# Patient Record
Sex: Male | Born: 1970 | Race: White | Hispanic: No | Marital: Married | Smoking: Never smoker
Health system: Southern US, Community
[De-identification: ages and names within clinical notes are randomized; demographics above are authoritative.]

## PROBLEM LIST (undated history)

## (undated) DIAGNOSIS — S46211D Strain of muscle, fascia and tendon of other parts of biceps, right arm, subsequent encounter: Secondary | ICD-10-CM

## (undated) DIAGNOSIS — I10 Essential (primary) hypertension: Secondary | ICD-10-CM

## (undated) HISTORY — DX: Strain of muscle, fascia and tendon of other parts of biceps, right arm, subsequent encounter: S46.211D

---

## 2005-11-07 ENCOUNTER — Emergency Department: Payer: Self-pay | Admitting: Emergency Medicine

## 2008-06-02 ENCOUNTER — Emergency Department: Payer: Self-pay | Admitting: Unknown Physician Specialty

## 2009-01-26 ENCOUNTER — Emergency Department: Payer: Self-pay | Admitting: Emergency Medicine

## 2010-07-12 ENCOUNTER — Emergency Department: Payer: Self-pay | Admitting: Emergency Medicine

## 2014-02-02 ENCOUNTER — Emergency Department: Payer: Self-pay | Admitting: Emergency Medicine

## 2015-05-30 IMAGING — CR DG CHEST 2V
1 series · 1 of 1 positions shown · non-contrast
Comparison: None.

CLINICAL DATA: Pain post trauma

EXAM:
CHEST  2 VIEW

[w chest pa]
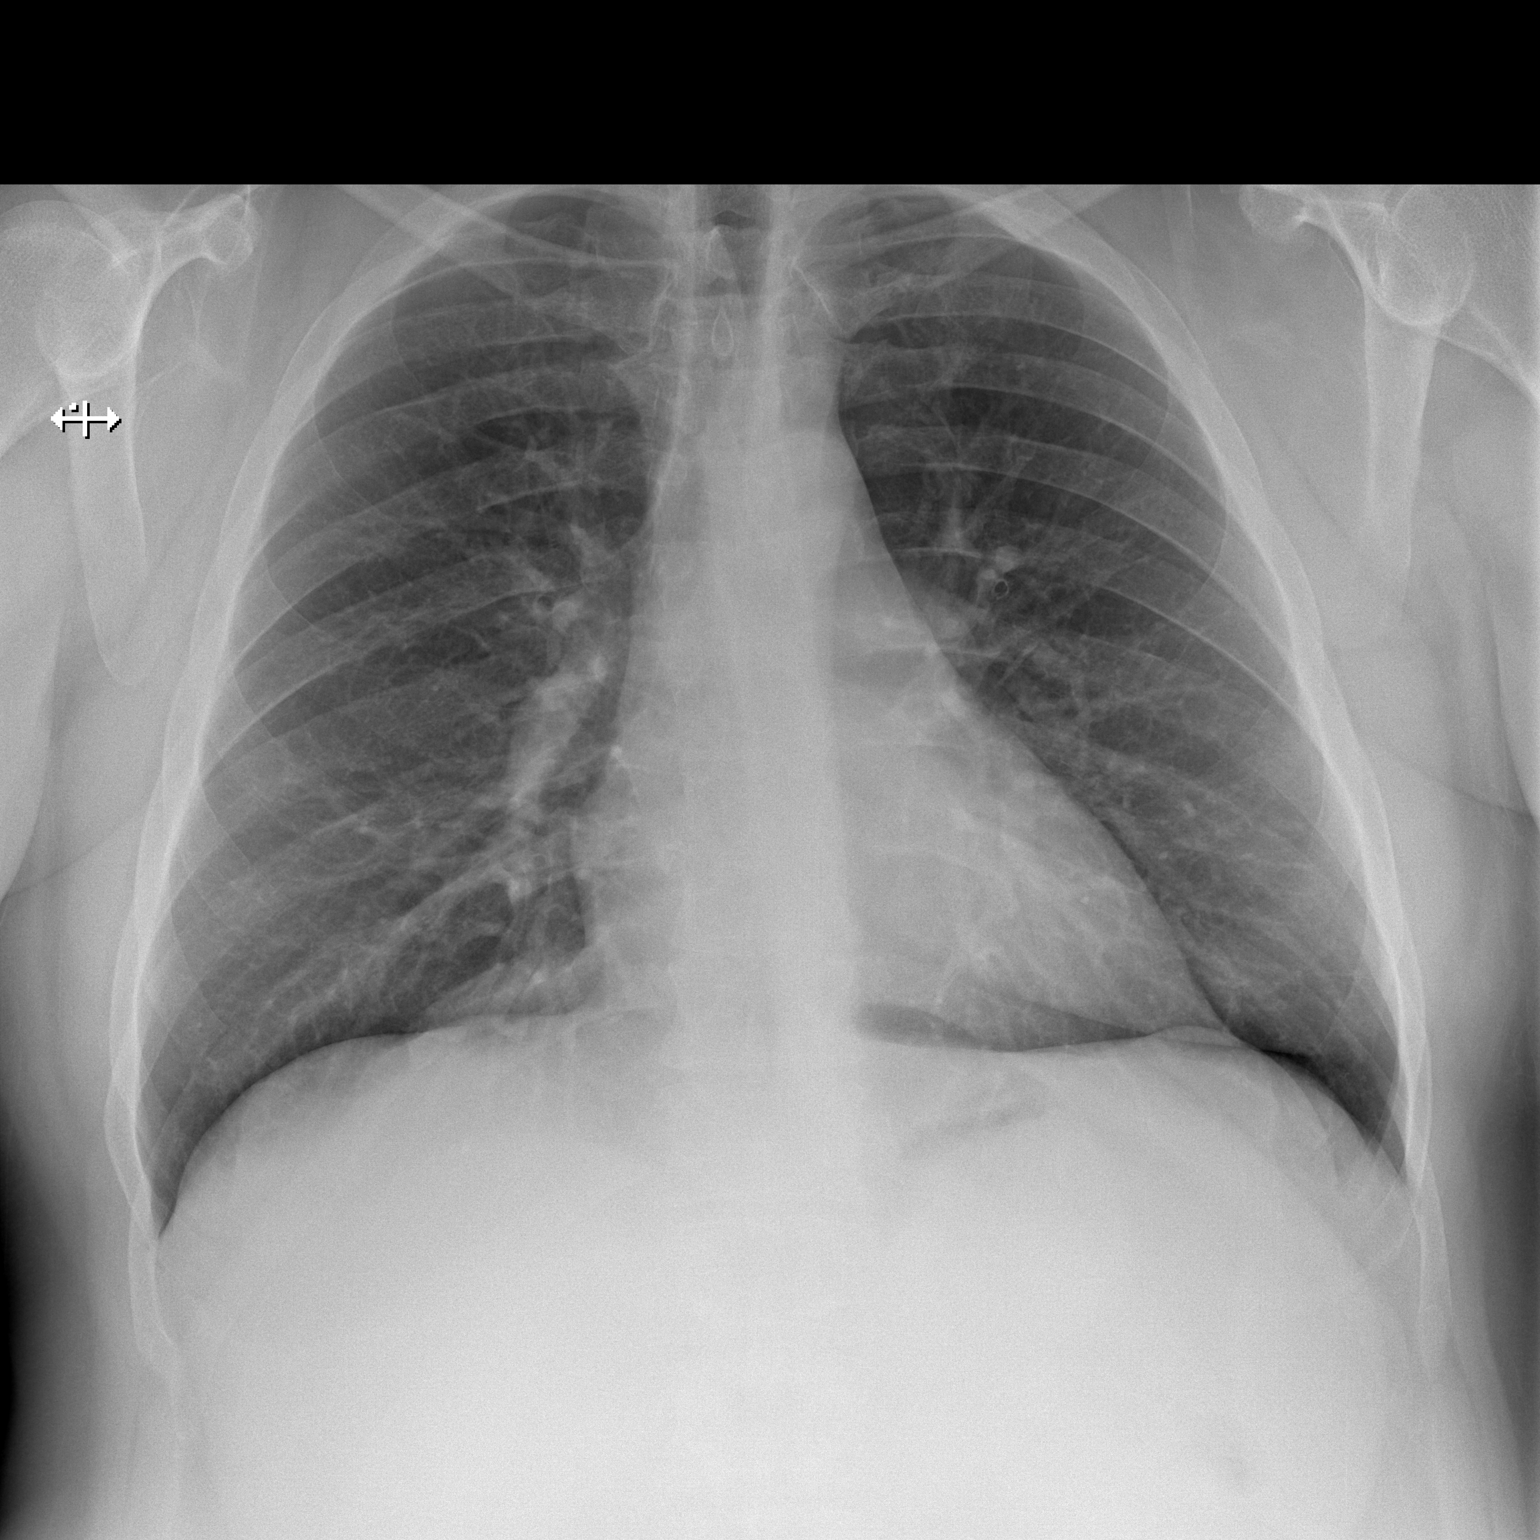

[1 of 1 positions shown; findings below may reference images not displayed]

FINDINGS: Lungs are clear. Heart size and pulmonary vascularity are normal. No
pneumothorax. No adenopathy. No bone lesions.
IMPRESSION: No abnormality noted.

## 2015-08-07 ENCOUNTER — Encounter: Payer: Self-pay | Admitting: *Deleted

## 2015-08-10 ENCOUNTER — Ambulatory Visit: Payer: 59 | Admitting: Anesthesiology

## 2015-08-10 ENCOUNTER — Ambulatory Visit
Admission: RE | Admit: 2015-08-10 | Discharge: 2015-08-10 | Disposition: A | Discharge status: Home / Self Care | Payer: 59 | Source: Ambulatory Visit | Attending: Gastroenterology | Admitting: Gastroenterology

## 2015-08-10 ENCOUNTER — Encounter: Payer: Self-pay | Admitting: *Deleted

## 2015-08-10 ENCOUNTER — Encounter
Admission: RE | Disposition: A | Discharge status: Home / Self Care | Payer: Self-pay | Source: Ambulatory Visit | Attending: Gastroenterology

## 2015-08-10 DIAGNOSIS — K6389 Other specified diseases of intestine: Secondary | ICD-10-CM | POA: Insufficient documentation

## 2015-08-10 DIAGNOSIS — Z8249 Family history of ischemic heart disease and other diseases of the circulatory system: Secondary | ICD-10-CM | POA: Insufficient documentation

## 2015-08-10 DIAGNOSIS — Z803 Family history of malignant neoplasm of breast: Secondary | ICD-10-CM | POA: Diagnosis not present

## 2015-08-10 DIAGNOSIS — Z8719 Personal history of other diseases of the digestive system: Secondary | ICD-10-CM | POA: Diagnosis not present

## 2015-08-10 DIAGNOSIS — Z1211 Encounter for screening for malignant neoplasm of colon: Secondary | ICD-10-CM | POA: Insufficient documentation

## 2015-08-10 DIAGNOSIS — Z800 Family history of malignant neoplasm of digestive organs: Secondary | ICD-10-CM | POA: Diagnosis present

## 2015-08-10 HISTORY — PX: COLONOSCOPY WITH PROPOFOL: SHX5780

## 2015-08-10 HISTORY — DX: Essential (primary) hypertension: I10

## 2015-08-10 SURGERY — COLONOSCOPY WITH PROPOFOL
Anesthesia: General

## 2015-08-10 MED ORDER — SODIUM CHLORIDE 0.9 % IV SOLN
INTRAVENOUS | Status: DC
  Administered 2015-08-10: 14:00:00 via INTRAVENOUS

## 2015-08-10 MED ORDER — PROPOFOL 500 MG/50ML IV EMUL
INTRAVENOUS | Status: DC | PRN
  Administered 2015-08-10: 130 ug/kg/min via INTRAVENOUS

## 2015-08-10 MED ORDER — SODIUM CHLORIDE 0.9 % IV SOLN: INTRAVENOUS | Status: DC

## 2015-08-10 MED ORDER — MIDAZOLAM HCL 2 MG/2ML IJ SOLN
INTRAMUSCULAR | Status: DC | PRN
  Administered 2015-08-10: 1 mg via INTRAVENOUS

## 2015-08-10 MED ORDER — SODIUM CHLORIDE 0.9 % IV SOLN
INTRAVENOUS | Status: DC
Start: 2015-08-10 — End: 2015-08-10
  Administered 2015-08-10: 14:00:00 via INTRAVENOUS

## 2015-08-10 MED ORDER — FENTANYL CITRATE (PF) 100 MCG/2ML IJ SOLN
INTRAMUSCULAR | Status: DC | PRN
  Administered 2015-08-10: 50 ug via INTRAVENOUS

## 2015-08-10 NOTE — H&P (Signed)
  Date of Initial H&P: 07/30/2015  History reviewed, patient examined, no change in status, stable for surgery. 

## 2015-08-10 NOTE — Anesthesia Preprocedure Evaluation (Addendum)
Anesthesia Evaluation  Patient identified by MRN, date of birth, ID band Patient awake    Reviewed: Allergy & Precautions, NPO status , Patient's Chart, lab work & pertinent test results  Airway Mallampati: III  TM Distance: >3 FB Neck ROM: Full    Dental  (+) Chipped   Pulmonary neg pulmonary ROS,    Pulmonary exam normal breath sounds clear to auscultation       Cardiovascular hypertension, Normal cardiovascular exam     Neuro/Psych negative neurological ROS  negative psych ROS   GI/Hepatic negative GI ROS, Neg liver ROS,   Endo/Other  negative endocrine ROS  Renal/GU negative Renal ROS  negative genitourinary   Musculoskeletal negative musculoskeletal ROS (+)   Abdominal Normal abdominal exam  (+)   Peds negative pediatric ROS (+)  Hematology negative hematology ROS (+)   Anesthesia Other Findings   Reproductive/Obstetrics                            Anesthesia Physical Anesthesia Plan  ASA: II  Anesthesia Plan: General   Post-op Pain Management:    Induction: Intravenous  Airway Management Planned: Nasal Cannula  Additional Equipment:   Intra-op Plan:   Post-operative Plan:   Informed Consent: I have reviewed the patients History and Physical, chart, labs and discussed the procedure including the risks, benefits and alternatives for the proposed anesthesia with the patient or authorized representative who has indicated his/her understanding and acceptance.   Dental advisory given  Plan Discussed with: CRNA and Surgeon  Anesthesia Plan Comments:         Anesthesia Quick Evaluation

## 2015-08-10 NOTE — Transfer of Care (Signed)
Immediate Anesthesia Transfer of Care Note  Patient: Jose JunglingChristopher T Behan Sr.  Procedure(s) Performed: Procedure(s): COLONOSCOPY WITH PROPOFOL (N/A)  Patient Location: PACU and Endoscopy Unit  Anesthesia Type:General  Level of Consciousness: sedated and responds to stimulation  Airway & Oxygen Therapy: Patient Spontanous Breathing and Patient connected to nasal cannula oxygen  Post-op Assessment: Report given to RN and Post -op Vital signs reviewed and stable  Post vital signs: Reviewed and stable  Last Vitals:  Filed Vitals:   08/10/15 1342 08/10/15 1442  BP: 133/80 119/69  Pulse: 62 69  Temp: 37.4 C 36.4 C  Resp: 15     Complications: No apparent anesthesia complications

## 2015-08-10 NOTE — Anesthesia Postprocedure Evaluation (Signed)
Anesthesia Post Note  Patient: Jose JunglingChristopher T Sardo Sr.  Procedure(s) Performed: Procedure(s) (LRB): COLONOSCOPY WITH PROPOFOL (N/A)  Patient location during evaluation: PACU Anesthesia Type: General Level of consciousness: awake Pain management: pain level controlled Vital Signs Assessment: post-procedure vital signs reviewed and stable Respiratory status: spontaneous breathing Cardiovascular status: blood pressure returned to baseline Postop Assessment: No headache Anesthetic complications: no    Last Vitals:  Filed Vitals:   08/10/15 1342 08/10/15 1442  BP: 133/80 119/69  Pulse: 62 69  Temp: 37.4 C 36.4 C  Resp: 15     Last Pain: There were no vitals filed for this visit.               Elizabth Palka M

## 2015-08-10 NOTE — Op Note (Signed)
[facility] Gastroenterology Patient Name: Jose Paul Procedure Date: 08/10/2015 2:16 PM MRN: [id number] Account #: [id number] Date of Birth: 11/15/1970 Admit Type: Outpatient Age: 44 Room: [facility] ENDO ROOM 4 Gender: Male Note Status: Finalized Procedure:         Colonoscopy Indications:       Screening in patient at increased risk: Family history of                     1st-degree relative with colorectal cancer Providers:         Ezzard Standing. Bluford Kaufmann, MD Referring MD:      Silas Flood. Ellsworth Lennox, MD (Referring MD) Medicines:         Monitored Anesthesia Care Complications:     No immediate complications. Procedure:         Pre-Anesthesia Assessment:                    - Prior to the procedure, a History and Physical was                     performed, and patient medications, allergies and                     sensitivities were reviewed. The patient's tolerance of                     previous anesthesia was reviewed.                    - The risks and benefits of the procedure and the sedation                     options and risks were discussed with the patient. All                     questions were answered and informed consent was obtained.                    - After reviewing the risks and benefits, the patient was                     deemed in satisfactory condition to undergo the procedure.                    After obtaining informed consent, the colonoscope was                     passed under direct vision. Throughout the procedure, the                     patient's blood pressure, pulse, and oxygen saturations                     were monitored continuously. The Colonoscope was                     introduced through the anus and advanced to the the cecum,                     identified by appendiceal orifice and ileocecal valve. The                     colonoscopy was performed without difficulty. The patient  tolerated the procedure well.  The quality of the bowel                     preparation was good. Findings:      The colon (entire examined portion) appeared normal. Possible nodule at       ileocecal valve. Biopsies taken. Impression:        - The entire examined colon is normal.                    - No specimens collected. Recommendation:    - Discharge patient to home.                    - Await pathology results.                    - Repeat colonoscopy in 5 years for surveillance.                    - The findings and recommendations were discussed with the                     patient. Procedure Code(s): --- Professional ---                    [phone] [id number], Colonoscopy, flexible; diagnostic, including                     collection of specimen(s) by brushing or washing, when                     performed (separate procedure) Diagnosis Code(s): --- Professional ---                    Z80.0, Family history of malignant neoplasm of digestive                     organs CPT copyright 2014 American Medical Association. All rights reserved. The codes documented in this report are preliminary and upon coder review may  be revised to meet current compliance requirements. Wallace CullensPaul Y Millette Halberstam, MD 08/10/2015 2:38:18 PM This report has been signed electronically. Number of Addenda: 0 Note Initiated On: 08/10/2015 2:16 PM Scope Withdrawal Time: 0 hours 7 minutes 36 seconds  Total Procedure Duration: 0 hours 9 minutes 34 seconds       [facility]

## 2015-08-11 ENCOUNTER — Encounter: Payer: Self-pay | Admitting: Gastroenterology

## 2015-08-12 LAB — SURGICAL PATHOLOGY

## 2022-11-17 ENCOUNTER — Other Ambulatory Visit: Payer: BC Managed Care – PPO

## 2022-11-17 ENCOUNTER — Other Ambulatory Visit: Payer: Self-pay | Admitting: Internal Medicine

## 2022-11-19 LAB — COMPREHENSIVE METABOLIC PANEL
ALT: 33 IU/L (ref 0–44)
AST: 26 IU/L (ref 0–40)
Albumin/Globulin Ratio: 1.7 (ref 1.2–2.2)
Albumin: 4.7 g/dL (ref 3.8–4.9)
Alkaline Phosphatase: 93 IU/L (ref 44–121)
BUN/Creatinine Ratio: 9 (ref 9–20)
BUN: 12 mg/dL (ref 6–24)
Bilirubin Total: 0.6 mg/dL (ref 0.0–1.2)
CO2: 22 mmol/L (ref 20–29)
Calcium: 9.3 mg/dL (ref 8.7–10.2)
Chloride: 101 mmol/L (ref 96–106)
Creatinine, Ser: 1.29 mg/dL — ABNORMAL HIGH (ref 0.76–1.27)
Globulin, Total: 2.8 g/dL (ref 1.5–4.5)
Glucose: 132 mg/dL — ABNORMAL HIGH (ref 70–99)
Potassium: 4.6 mmol/L (ref 3.5–5.2)
Sodium: 139 mmol/L (ref 134–144)
Total Protein: 7.5 g/dL (ref 6.0–8.5)
eGFR: 67 mL/min/{1.73_m2} (ref >59)

## 2022-11-19 LAB — LIPID PANEL W/O CHOL/HDL RATIO
Cholesterol, Total: 200 mg/dL — ABNORMAL HIGH (ref 100–199)
HDL: 46 mg/dL (ref >39)
LDL Chol Calc (NIH): 140 mg/dL — ABNORMAL HIGH (ref 0–99)
Triglycerides: 78 mg/dL (ref 0–149)
VLDL Cholesterol Cal: 14 mg/dL (ref 5–40)

## 2022-11-19 LAB — CBC WITH DIFFERENTIAL/PLATELET
Basophils Absolute: 0 10*3/uL (ref 0.0–0.2)
Basos: 0 %
EOS (ABSOLUTE): 0.2 10*3/uL (ref 0.0–0.4)
Eos: 3 %
Hematocrit: 45.3 % (ref 37.5–51.0)
Hemoglobin: 15.4 g/dL (ref 13.0–17.7)
Immature Grans (Abs): 0 10*3/uL (ref 0.0–0.1)
Immature Granulocytes: 0 %
Lymphocytes Absolute: 1.1 10*3/uL (ref 0.7–3.1)
Lymphs: 16 %
MCH: 29.7 pg (ref 26.6–33.0)
MCHC: 34 g/dL (ref 31.5–35.7)
MCV: 88 fL (ref 79–97)
Monocytes Absolute: 0.5 10*3/uL (ref 0.1–0.9)
Monocytes: 8 %
Neutrophils Absolute: 5 10*3/uL (ref 1.4–7.0)
Neutrophils: 73 %
Platelets: 239 10*3/uL (ref 150–450)
RBC: 5.18 x10E6/uL (ref 4.14–5.80)
RDW: 13 % (ref 11.6–15.4)
WBC: 6.8 10*3/uL (ref 3.4–10.8)

## 2022-11-19 LAB — PSA, SERUM (SERIAL MONITOR): Prostate Specific Ag, Serum: 0.9 ng/mL (ref 0.0–4.0)

## 2022-11-22 ENCOUNTER — Encounter: Payer: Self-pay | Admitting: Internal Medicine

## 2022-11-22 ENCOUNTER — Encounter: Payer: BC Managed Care – PPO | Admitting: Internal Medicine

## 2022-11-23 ENCOUNTER — Ambulatory Visit (INDEPENDENT_AMBULATORY_CARE_PROVIDER_SITE_OTHER): Payer: BC Managed Care – PPO | Admitting: Internal Medicine

## 2022-11-23 ENCOUNTER — Encounter: Payer: Self-pay | Admitting: Internal Medicine

## 2022-11-23 VITALS — BP 104/84 | HR 95 | Ht 71.0 in | Wt 302.4 lb

## 2022-11-23 DIAGNOSIS — Z0001 Encounter for general adult medical examination with abnormal findings: Secondary | ICD-10-CM | POA: Diagnosis not present

## 2022-11-23 DIAGNOSIS — E7800 Pure hypercholesterolemia, unspecified: Secondary | ICD-10-CM

## 2022-11-23 DIAGNOSIS — N182 Chronic kidney disease, stage 2 (mild): Secondary | ICD-10-CM

## 2022-11-23 DIAGNOSIS — Z6841 Body Mass Index (BMI) 40.0 and over, adult: Secondary | ICD-10-CM

## 2022-11-23 DIAGNOSIS — Z1331 Encounter for screening for depression: Secondary | ICD-10-CM

## 2022-11-23 NOTE — Progress Notes (Signed)
Established Patient Office Visit  Subjective:  Patient ID: Jose Sciacca., male    DOB: December 21, 1970  Age: 52 y.o. MRN: [id number]  Chief Complaint  Patient presents with   Annual Exam    CPE, lab results.    No new complaints, here for CPE, lab review and medication refills. Labs reviewed and notable for slight improvement in Cr, deterioration in cholesterol with normal cbc and psa.   Past Medical History:  Diagnosis Date   Hypertension     Past Surgical History:  Procedure Laterality Date   COLONOSCOPY WITH PROPOFOL N/A 08/10/2015   Procedure: COLONOSCOPY WITH PROPOFOL;  Surgeon: Hulen Luster, MD;  Location: [facility] ENDOSCOPY;  Service: Gastroenterology;  Laterality: N/A;    Social History   Socioeconomic History   Marital status: Married    Spouse name: Not on file   Number of children: Not on file   Years of education: Not on file   Highest education level: Not on file  Occupational History   Not on file  Tobacco Use   Smoking status: Never   Smokeless tobacco: Never  Substance and Sexual Activity   Alcohol use: Yes    Alcohol/week: 1.0 standard drink of alcohol    Types: 1 Cans of beer per week   Drug use: No   Sexual activity: Not on file  Other Topics Concern   Not on file  Social History Narrative   Not on file   Social Determinants of Health   Financial Resource Strain: Not on file  Food Insecurity: Not on file  Transportation Needs: Not on file  Physical Activity: Not on file  Stress: Not on file  Social Connections: Not on file  Intimate Partner Violence: Not on file    No family history on file.  Allergies  Allergen Reactions   Lactose Intolerance (Gi) Cough    Review of Systems  Constitutional: Negative.   HENT: Negative.    Eyes: Negative.   Respiratory: Negative.    Cardiovascular: Negative.   Gastrointestinal: Negative.   Genitourinary: Negative.   Skin: Negative.   Neurological: Negative.   Endo/Heme/Allergies:  Negative.        Objective:   BP 104/84   Pulse 95   Ht '5\' 11"'$  (1.803 m)   Wt (!) 302 lb 6.4 oz (137.2 kg)   SpO2 98%   BMI 42.18 kg/m   Vitals:   11/23/22 0935  BP: 104/84  Pulse: 95  Height: '5\' 11"'$  (1.803 m)  Weight: (!) 302 lb 6.4 oz (137.2 kg)  SpO2: 98%  BMI (Calculated): 42.2    Physical Exam   No results found for any visits on 11/23/22.  Recent Results (from the past 2160 hour(Yaire Kreher))  CBC with Differential/Platelet     Status: None   Collection Time: 11/17/22  8:33 AM  Result Value Ref Range   WBC 6.8 3.4 - 10.8 x10E3/uL   RBC 5.18 4.14 - 5.80 x10E6/uL   Hemoglobin 15.4 13.0 - 17.7 g/dL   Hematocrit 45.3 37.5 - 51.0 %   MCV 88 79 - 97 fL   MCH 29.7 26.6 - 33.0 pg   MCHC 34.0 31.5 - 35.7 g/dL   RDW 13.0 11.6 - 15.4 %   Platelets 239 150 - 450 x10E3/uL   Neutrophils 73 Not Estab. %   Lymphs 16 Not Estab. %   Monocytes 8 Not Estab. %   Eos 3 Not Estab. %   Basos 0 Not Estab. %  Neutrophils Absolute 5.0 1.4 - 7.0 x10E3/uL   Lymphocytes Absolute 1.1 0.7 - 3.1 x10E3/uL   Monocytes Absolute 0.5 0.1 - 0.9 x10E3/uL   EOS (ABSOLUTE) 0.2 0.0 - 0.4 x10E3/uL   Basophils Absolute 0.0 0.0 - 0.2 x10E3/uL   Immature Granulocytes 0 Not Estab. %   Immature Grans (Abs) 0.0 0.0 - 0.1 x10E3/uL  Comprehensive metabolic panel     Status: Abnormal   Collection Time: 11/17/22  8:33 AM  Result Value Ref Range   Glucose 132 (H) 70 - 99 mg/dL   BUN 12 6 - 24 mg/dL   Creatinine, Ser 1.29 (H) 0.76 - 1.27 mg/dL   eGFR 67 >59 mL/min/1.73   BUN/Creatinine Ratio 9 9 - 20   Sodium 139 134 - 144 mmol/L   Potassium 4.6 3.5 - 5.2 mmol/L   Chloride 101 96 - 106 mmol/L   CO2 22 20 - 29 mmol/L   Calcium 9.3 8.7 - 10.2 mg/dL   Total Protein 7.5 6.0 - 8.5 g/dL   Albumin 4.7 3.8 - 4.9 g/dL   Globulin, Total 2.8 1.5 - 4.5 g/dL   Albumin/Globulin Ratio 1.7 1.2 - 2.2   Bilirubin Total 0.6 0.0 - 1.2 mg/dL   Alkaline Phosphatase 93 44 - 121 IU/L   AST 26 0 - 40 IU/L   ALT 33 0 - 44  IU/L  Lipid Panel w/o Chol/HDL Ratio     Status: Abnormal   Collection Time: 11/17/22  8:33 AM  Result Value Ref Range   Cholesterol, Total 200 (H) 100 - 199 mg/dL   Triglycerides 78 0 - 149 mg/dL   HDL 46 >39 mg/dL   VLDL Cholesterol Cal 14 5 - 40 mg/dL   LDL Chol Calc (NIH) 140 (H) 0 - 99 mg/dL  PSA, SERUM (SERIAL MONITOR)     Status: None   Collection Time: 11/17/22  8:33 AM  Result Value Ref Range   Prostate Specific Ag, Serum 0.9 0.0 - 4.0 ng/mL    Comment: Roche ECLIA methodology. According to the American Urological Association, Serum PSA should decrease and remain at undetectable levels after radical prostatectomy. The AUA defines biochemical recurrence as an initial PSA value 0.2 ng/mL or greater followed by a subsequent confirmatory PSA value 0.2 ng/mL or greater. Values obtained with different assay methods or kits cannot be used interchangeably. Results cannot be interpreted as absolute evidence of the presence or absence of malignant disease.       Assessment & Plan:   Problem List Items Addressed This Visit   None 1. CKD (chronic kidney disease) stage 2, GFR 60-89 ml/min - Comprehensive metabolic panel; Future  2. Pure hypercholesterolemia - Lipid panel; Future  3. Morbid obesity with BMI of 40.0-44.9, adult (HCC) - Testosterone Free with SHBG; Future - TSH; Future    No follow-ups on file.   Total time spent: 30 minutes  Volanda Napoleon, MD  11/23/2022

## 2022-11-25 NOTE — Progress Notes (Deleted)
This encounter was created in error - please disregard.  This encounter was created in error - please disregard.

## 2022-11-30 NOTE — Progress Notes (Deleted)
This encounter was created in error - please disregard.  Established Patient Office Visit  Subjective   Patient ID: Jose Paul., male    DOB: 12-03-1970  Age: 52 y.o. MRN: [id number]  Chief Complaint  Patient presents with   Annual Exam    CPE, review labs.    HPI  {History (Optional):23778}  ROS    Objective:     BP 130/76   Pulse 70   Ht '5\' 11"'$  (1.803 m)   Wt (!) 300 lb 12.8 oz (136.4 kg)   SpO2 98%   BMI 41.95 kg/m  {Vitals History (Optional):23777}  Physical Exam   No results found for any visits on 11/22/22.  {Labs (Optional):23779}  The 10-year ASCVD risk score (Arnett DK, et al., 2019) is: 2.9%    Assessment & Plan:   Problem List Items Addressed This Visit   None   No follow-ups on file.    Lourena Simmonds

## 2023-02-22 ENCOUNTER — Other Ambulatory Visit: Payer: BC Managed Care – PPO

## 2023-02-22 DIAGNOSIS — E7800 Pure hypercholesterolemia, unspecified: Secondary | ICD-10-CM

## 2023-02-22 DIAGNOSIS — N182 Chronic kidney disease, stage 2 (mild): Secondary | ICD-10-CM

## 2023-02-24 ENCOUNTER — Ambulatory Visit: Payer: BC Managed Care – PPO | Admitting: Internal Medicine

## 2023-02-27 ENCOUNTER — Encounter: Payer: Self-pay | Admitting: Internal Medicine

## 2023-02-27 ENCOUNTER — Ambulatory Visit: Payer: BC Managed Care – PPO | Admitting: Internal Medicine

## 2023-02-27 VITALS — BP 130/78 | HR 66 | Ht 71.0 in | Wt 298.4 lb

## 2023-02-27 DIAGNOSIS — Z6841 Body Mass Index (BMI) 40.0 and over, adult: Secondary | ICD-10-CM

## 2023-02-27 DIAGNOSIS — E7800 Pure hypercholesterolemia, unspecified: Secondary | ICD-10-CM

## 2023-02-27 DIAGNOSIS — R7303 Prediabetes: Secondary | ICD-10-CM

## 2023-02-27 DIAGNOSIS — E782 Mixed hyperlipidemia: Secondary | ICD-10-CM | POA: Diagnosis not present

## 2023-02-27 DIAGNOSIS — E119 Type 2 diabetes mellitus without complications: Secondary | ICD-10-CM | POA: Insufficient documentation

## 2023-02-27 NOTE — Progress Notes (Signed)
Established Patient Office Visit  Subjective:  Patient ID: Jose Elsenpeter., male    DOB: 1971-08-23  Age: 52 y.o. MRN: [id number]  Chief Complaint  Patient presents with   Follow-up    3 month follow up, discuss lab results.    No new complaints, here for lab review and medication refills. Modest weight loss. Labs reviewed and notable for elevated fasting glucose, elevated LDL with normal TC. CMP otherwise unremarkable with normal TSH while Testosterone is pending.    No other concerns at this time.   Past Medical History:  Diagnosis Date   Biceps tendon rupture, right, subsequent encounter    Hypertension     Past Surgical History:  Procedure Laterality Date   COLONOSCOPY WITH PROPOFOL N/A 08/10/2015   Procedure: COLONOSCOPY WITH PROPOFOL;  Surgeon: Wallace Cullens, MD;  Location: [facility] ENDOSCOPY;  Service: Gastroenterology;  Laterality: N/A;    Social History   Socioeconomic History   Marital status: Married    Spouse name: Not on file   Number of children: Not on file   Years of education: Not on file   Highest education level: Not on file  Occupational History   Not on file  Tobacco Use   Smoking status: Never   Smokeless tobacco: Never  Substance and Sexual Activity   Alcohol use: Yes    Alcohol/week: 1.0 standard drink of alcohol    Types: 1 Cans of beer per week   Drug use: No   Sexual activity: Yes  Other Topics Concern   Not on file  Social History Narrative   Not on file   Social Determinants of Health   Financial Resource Strain: Not on file  Food Insecurity: Not on file  Transportation Needs: Not on file  Physical Activity: Not on file  Stress: Not on file  Social Connections: Not on file  Intimate Partner Violence: Not on file    History reviewed. No pertinent family history.  Allergies  Allergen Reactions   Lactose Intolerance (Gi) Cough    Review of Systems  Constitutional: Negative.   HENT: Negative.    Eyes: Negative.    Respiratory: Negative.    Cardiovascular: Negative.   Gastrointestinal: Negative.   Genitourinary: Negative.   Skin: Negative.   Neurological: Negative.   Endo/Heme/Allergies: Negative.        Objective:   BP 130/78   Pulse 66   Ht 5\' 11"  (1.803 m)   Wt 298 lb 6.4 oz (135.4 kg)   SpO2 98%   BMI 41.62 kg/m   Vitals:   02/27/23 1441  BP: 130/78  Pulse: 66  Height: 5\' 11"  (1.803 m)  Weight: 298 lb 6.4 oz (135.4 kg)  SpO2: 98%  BMI (Calculated): 41.64    Physical Exam Vitals reviewed.  Constitutional:      Appearance: Normal appearance. He is obese.  HENT:     Head: Normocephalic.     Left Ear: There is no impacted cerumen.     Nose: Nose normal.     Mouth/Throat:     Mouth: Mucous membranes are moist.     Pharynx: No posterior oropharyngeal erythema.  Eyes:     Extraocular Movements: Extraocular movements intact.     Pupils: Pupils are equal, round, and reactive to light.  Cardiovascular:     Rate and Rhythm: Regular rhythm.     Chest Wall: PMI is not displaced.     Pulses: Normal pulses.     Heart sounds: Normal heart  sounds. No murmur heard. Pulmonary:     Effort: Pulmonary effort is normal.     Breath sounds: Normal air entry. No rhonchi or rales.  Abdominal:     General: Abdomen is flat. Bowel sounds are normal. There is no distension.     Palpations: Abdomen is soft. There is no hepatomegaly, splenomegaly or mass.     Tenderness: There is no abdominal tenderness.  Musculoskeletal:        General: Normal range of motion.     Cervical back: Normal range of motion and neck supple.     Right lower leg: No edema.     Left lower leg: No edema.  Skin:    General: Skin is warm and dry.  Neurological:     General: No focal deficit present.     Mental Status: He is alert and oriented to person, place, and time.     Cranial Nerves: No cranial nerve deficit.     Motor: No weakness.  Psychiatric:        Mood and Affect: Mood normal.        Behavior:  Behavior normal.      No results found for any visits on 02/27/23.  No results found for this or any previous visit (from the past 2160 hour(Jamielyn Petrucci)).    Assessment & Plan:   As per problem list  Problem List Items Addressed This Visit       Other   Morbid obesity with BMI of 40.0-44.9, adult (HCC)   Mixed hyperlipidemia   Pure hypercholesterolemia - Primary   Other Visit Diagnoses     Prediabetes       Relevant Orders   Hemoglobin A1c       No follow-ups on file.   Total time spent: 30 minutes  Luna Fuse, MD  02/27/2023   This document may have been prepared by [facility] Voice Recognition software and as such may include unintentional dictation errors.

## 2023-03-01 LAB — COMPREHENSIVE METABOLIC PANEL
ALT: 34 IU/L (ref 0–44)
AST: 26 IU/L (ref 0–40)
Albumin/Globulin Ratio: 1.6 (ref 1.2–2.2)
Albumin: 4.4 g/dL (ref 3.8–4.9)
Alkaline Phosphatase: 96 IU/L (ref 44–121)
BUN/Creatinine Ratio: 14 (ref 9–20)
BUN: 17 mg/dL (ref 6–24)
Bilirubin Total: 0.4 mg/dL (ref 0.0–1.2)
CO2: 22 mmol/L (ref 20–29)
Calcium: 9.1 mg/dL (ref 8.7–10.2)
Chloride: 103 mmol/L (ref 96–106)
Creatinine, Ser: 1.21 mg/dL (ref 0.76–1.27)
Globulin, Total: 2.7 g/dL (ref 1.5–4.5)
Glucose: 130 mg/dL — ABNORMAL HIGH (ref 70–99)
Potassium: 4.6 mmol/L (ref 3.5–5.2)
Sodium: 139 mmol/L (ref 134–144)
Total Protein: 7.1 g/dL (ref 6.0–8.5)
eGFR: 72 mL/min/{1.73_m2} (ref >59)

## 2023-03-01 LAB — LIPID PANEL
Chol/HDL Ratio: 3.7 ratio (ref 0.0–5.0)
Cholesterol, Total: 183 mg/dL (ref 100–199)
HDL: 49 mg/dL (ref >39)
LDL Chol Calc (NIH): 122 mg/dL — ABNORMAL HIGH (ref 0–99)
Triglycerides: 63 mg/dL (ref 0–149)
VLDL Cholesterol Cal: 12 mg/dL (ref 5–40)

## 2023-03-01 LAB — TESTOSTERONE, FREE AND TOTAL (INCLUDES SHBG)-(MALES)
% Free Testosterone: 1.7 %
Free Testosterone, S: 53 pg/mL
Sex Hormone Binding Globulin: 34.8 nmol/L
Testosterone, Serum (Total): 311 ng/dL

## 2023-03-01 LAB — TSH: TSH: 1.3 u[IU]/mL (ref 0.450–4.500)

## 2023-03-03 ENCOUNTER — Telehealth: Payer: Self-pay

## 2023-03-03 NOTE — Telephone Encounter (Signed)
Patient called aabout diabetes medication and if he was needing to start something to help with that and to help him lose weight?

## 2023-03-03 NOTE — Telephone Encounter (Signed)
Noted those results aren't back yet still

## 2023-03-06 ENCOUNTER — Telehealth: Payer: Self-pay | Admitting: Internal Medicine

## 2023-03-06 NOTE — Telephone Encounter (Signed)
Patient left VM wanting to know if he is supposed to start on diabetes injections? He is very eager to know if he is diabetic and if he needs to start injections. Please advise.

## 2023-03-10 ENCOUNTER — Other Ambulatory Visit: Payer: Self-pay | Admitting: Internal Medicine

## 2023-03-10 DIAGNOSIS — R7303 Prediabetes: Secondary | ICD-10-CM

## 2023-03-28 ENCOUNTER — Telehealth: Payer: Self-pay | Admitting: Internal Medicine

## 2023-03-28 NOTE — Telephone Encounter (Signed)
Patient left VM regarding his recent lab results - wants to know if he needs to start diabetic medication. Please advise.

## 2023-03-29 ENCOUNTER — Other Ambulatory Visit: Payer: BC Managed Care – PPO

## 2023-03-30 LAB — HEMOGLOBIN A1C
Est. average glucose Bld gHb Est-mCnc: 157 mg/dL
Hgb A1c MFr Bld: 7.1 % — ABNORMAL HIGH (ref 4.8–5.6)

## 2023-03-31 ENCOUNTER — Other Ambulatory Visit: Payer: Self-pay

## 2023-03-31 ENCOUNTER — Telehealth: Payer: Self-pay | Admitting: Internal Medicine

## 2023-03-31 DIAGNOSIS — E119 Type 2 diabetes mellitus without complications: Secondary | ICD-10-CM

## 2023-03-31 MED ORDER — METFORMIN HCL ER 500 MG PO TB24: 500.0000 mg | ORAL_TABLET | Freq: Every day | ORAL | 1 refills | Status: DC

## 2023-03-31 MED ORDER — SEMAGLUTIDE(0.25 OR 0.5MG/DOS) 2 MG/3ML ~~LOC~~ SOPN: PEN_INJECTOR | SUBCUTANEOUS | 1 refills | Status: DC

## 2023-03-31 NOTE — Progress Notes (Signed)
Spoke with patient verified DOB and verbalized understanding MD suggestions and Rx orders that were sent to pharmacy.

## 2023-03-31 NOTE — Telephone Encounter (Signed)
Patient left VM wanting his A1c results and wanting to know if he needs to start diabetes medication? He said you discussed possible injections with him that could help with diabetes and weight loss. Please advise.

## 2023-04-06 ENCOUNTER — Telehealth: Payer: Self-pay | Admitting: Internal Medicine

## 2023-04-06 NOTE — Telephone Encounter (Signed)
Patient called in wanting to know if he needs a CGM since he was recently diagnosed with diabetes. Please advise.

## 2023-04-17 ENCOUNTER — Encounter: Payer: BC Managed Care – PPO | Attending: Internal Medicine | Admitting: Dietician

## 2023-04-17 ENCOUNTER — Encounter: Payer: Self-pay | Admitting: Dietician

## 2023-04-17 DIAGNOSIS — Z713 Dietary counseling and surveillance: Secondary | ICD-10-CM | POA: Diagnosis not present

## 2023-04-17 DIAGNOSIS — E119 Type 2 diabetes mellitus without complications: Secondary | ICD-10-CM | POA: Insufficient documentation

## 2023-04-17 NOTE — Progress Notes (Signed)
Diabetes Self-Management Education  Visit Type: First/Initial  Appt. Start Time: 0805 Appt. End Time: 0930  04/17/2023  [name], identified by name and date of birth, is a 52 y.o. male with a diagnosis of Diabetes: Type 2.   ASSESSMENT  There were no vitals taken for this visit. There is no height or weight on file to calculate BMI.   Pt wife, Jose Paul, present for visit Pt reports starting metformin once daily and Ozempic weekly for ~3 weeks, no complications or side effects. SMBG not recommended by PCP at this time. Pt reports decreased appetite/early satiety since starting Ozempic. Pt reports doing "[employer]" electrode stimulation on their legs while sedentary, will ride stationary bike at home ~6 miles/20 minutes  each time. Pt reports history of trying keto, lost ~70 lbs over 12 - 18 months, eventually gained the weight back. 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   Diabetes Self-Management Education - 04/17/23 0814       Visit Information   Visit Type First/Initial      Initial Visit   Diabetes Type Type 2    Date Diagnosed 03/29/2023    Are you currently following a meal plan? No    Are you taking your medications as prescribed? Yes      Psychosocial Assessment   Patient Belief/Attitude about Diabetes Motivated to manage diabetes    What is the hardest part about your diabetes right now, causing you the most concern, or is the most worrisome to you about your diabetes?   Making healty food and beverage choices    Self-care barriers None    Self-management support Doctor's office;Family    Other persons present Patient;Spouse/SO    Patient Concerns Nutrition/Meal planning;Glycemic Control;Weight Control    Special Needs None    Preferred Learning Style Other (comment)    Learning Readiness Ready    How often do you need to have someone help you when  you read instructions, pamphlets, or other written materials from your doctor or pharmacy? 1 - Never    What is the last grade level you completed in school? masters      Pre-Education Assessment   Patient understands the diabetes disease and treatment process. Needs Instruction    Patient understands incorporating nutritional management into lifestyle. Needs Instruction    Patient undertands incorporating physical activity into lifestyle. Needs Instruction    Patient understands using medications safely. Needs Instruction    Patient understands monitoring blood glucose, interpreting and using results Needs Instruction    Patient understands prevention, detection, and treatment of acute complications. Needs Instruction    Patient understands prevention, detection, and treatment of chronic complications. Needs Instruction    Patient understands how to develop strategies to address psychosocial issues. Needs Instruction    Patient understands how to develop strategies to promote health/change behavior. Needs Instruction      Complications   Last HgB A1C per patient/outside source 7.1 %   03/29/2023   How often do you check your blood sugar? 0 times/day (not testing)    Have you had a dilated eye exam in the past 12 months? Yes    Have you had a dental exam in the past 12 months? Yes    Are you checking your feet? N/A      Activity / Exercise   Activity / Exercise Type ADL's    How many days per  week do you exercise? 7    How many minutes per day do you exercise? 60    Total minutes per week of exercise 420      Patient Education   Previous Diabetes Education No    Disease Pathophysiology Definition of diabetes, type 1 and 2, and the diagnosis of diabetes;Factors that contribute to the development of diabetes;Explored patient's options for treatment of their diabetes    Healthy Eating Role of diet in the treatment of diabetes and the relationship between the three main macronutrients and  blood glucose level;Plate Method;Information on hints to eating out and maintain blood glucose control.    Being Active Role of exercise on diabetes management, blood pressure control and cardiac health.;Helped patient identify appropriate exercises in relation to his/her diabetes, diabetes complications and other health issue.    Medications Reviewed patients medication for diabetes, action, purpose, timing of dose and side effects.    Monitoring Identified appropriate SMBG and/or A1C goals.   A1c >7.0   Chronic complications Relationship between chronic complications and blood glucose control    Diabetes Stress and Support Role of stress on diabetes;Helped patient identify a support system for diabetes management;Brainstormed with patient on coping mechanisms for social situations, getting support from significant others, dealing with feelings about diabetes      Individualized Goals (developed by patient)   Nutrition Follow meal plan discussed    Physical Activity Exercise 3-5 times per week    Medications take my medication as prescribed    Monitoring  Not Applicable    Problem Solving Eating Pattern;Addressing barriers to behavior change   All or nothing mentality   Health Coping Discuss barriers to diabetes care with support person/system (comment specifics as needed)   wife     Post-Education Assessment   Patient understands the diabetes disease and treatment process. Comprehends key points    Patient understands incorporating nutritional management into lifestyle. Comprehends key points    Patient undertands incorporating physical activity into lifestyle. Comprehends key points    Patient understands using medications safely. Demonstrates understanding / competency    Patient understands monitoring blood glucose, interpreting and using results N/A    Patient understands prevention, detection, and treatment of acute complications. N/A    Patient understands prevention, detection, and  treatment of chronic complications. Comprehends key points    Patient understands how to develop strategies to address psychosocial issues. Comprehends key points    Patient understands how to develop strategies to promote health/change behavior. Comprehends key points      Outcomes   Expected Outcomes Demonstrated interest in learning. Expect positive outcomes    Future DMSE 4-6 wks    Program Status Not Completed             Individualized Plan for Diabetes Self-Management Training:   Learning Objective:  Patient will have a greater understanding of diabetes self-management. Patient education plan is to attend individual and/or group sessions per assessed needs and concerns.   Plan:   Patient Instructions  Break up periods of being sedentary by doing 3-5 minutes of moderate intensity activity (walk the stairs, recumbent bike, etc.)  Add protein powder to your coffee each morning  Work towards eating three meals a day, about 5-6 hours apart!  Begin to recognize carbohydrates, proteins, and non-starchy vegetables in your food choices!  Begin to build your meals using the proportions of the Balanced Plate. First, select your carb choice(s) for the meal, Make this 25% of your meal. Next, select  your source of protein to pair with your carb choice(s). Make this another 25% of your meal. Finally, complete your meal with a variety of non-starchy vegetables. Make this the remaining 50% of your meal.   Expected Outcomes:  Demonstrated interest in learning. Expect positive outcomes  Education material provided: My Plate and Snack sheet, Food list  If problems or questions, patient to contact team via:  Phone and Email  Future DSME appointment: 4-6 wks

## 2023-04-17 NOTE — Patient Instructions (Signed)
Break up periods of being sedentary by doing 3-5 minutes of moderate intensity activity (walk the stairs, recumbent bike, etc.)  Add protein powder to your coffee each morning  Work towards eating three meals a day, about 5-6 hours apart!  Begin to recognize carbohydrates, proteins, and non-starchy vegetables in your food choices!  Begin to build your meals using the proportions of the Balanced Plate. First, select your carb choice(s) for the meal, Make this 25% of your meal. Next, select your source of protein to pair with your carb choice(s). Make this another 25% of your meal. Finally, complete your meal with a variety of non-starchy vegetables. Make this the remaining 50% of your meal.

## 2023-05-03 ENCOUNTER — Other Ambulatory Visit: Payer: Self-pay | Admitting: Internal Medicine

## 2023-05-03 ENCOUNTER — Other Ambulatory Visit: Payer: BC Managed Care – PPO

## 2023-05-03 DIAGNOSIS — E119 Type 2 diabetes mellitus without complications: Secondary | ICD-10-CM

## 2023-05-04 LAB — FRUCTOSAMINE: Fructosamine: 224 umol/L (ref 0–285)

## 2023-05-10 ENCOUNTER — Ambulatory Visit: Payer: BC Managed Care – PPO | Admitting: Internal Medicine

## 2023-05-10 VITALS — BP 122/69 | HR 63 | Ht 70.0 in | Wt 277.4 lb

## 2023-05-10 DIAGNOSIS — E119 Type 2 diabetes mellitus without complications: Secondary | ICD-10-CM

## 2023-05-10 LAB — POCT CBG (FASTING - GLUCOSE)-MANUAL ENTRY: Glucose Fasting, POC: 93 mg/dL (ref 70–99)

## 2023-05-10 MED ORDER — SEMAGLUTIDE (1 MG/DOSE) 4 MG/3ML ~~LOC~~ SOPN: 1.0000 mg | PEN_INJECTOR | SUBCUTANEOUS | 1 refills | Status: DC

## 2023-05-10 MED ORDER — METFORMIN HCL ER 500 MG PO TB24: 500.0000 mg | ORAL_TABLET | Freq: Every day | ORAL | 1 refills | Status: DC

## 2023-05-10 NOTE — Progress Notes (Signed)
Established Patient Office Visit  Subjective:  Patient ID: Jose Paul., male    DOB: Feb 16, 1971  Age: 52 y.o. MRN: [id number]  Chief Complaint  Patient presents with   Follow-up    No new complaints, here for lab review and medication refills. Labs reviewed and notable for well controlled diabetes, fructosamine at target. Denies any hypoglycemic episodes and home bg readings have been at target.     No other concerns at this time.   Past Medical History:  Diagnosis Date   Biceps tendon rupture, right, subsequent encounter    Hypertension     Past Surgical History:  Procedure Laterality Date   COLONOSCOPY WITH PROPOFOL N/A 08/10/2015   Procedure: COLONOSCOPY WITH PROPOFOL;  Surgeon: Wallace Cullens, MD;  Location: [facility] ENDOSCOPY;  Service: Gastroenterology;  Laterality: N/A;    Social History   Socioeconomic History   Marital status: Married    Spouse name: Not on file   Number of children: Not on file   Years of education: Not on file   Highest education level: Not on file  Occupational History   Not on file  Tobacco Use   Smoking status: Never   Smokeless tobacco: Never  Substance and Sexual Activity   Alcohol use: Yes    Alcohol/week: 1.0 standard drink of alcohol    Types: 1 Cans of beer per week   Drug use: No   Sexual activity: Yes  Other Topics Concern   Not on file  Social History Narrative   Not on file   Social Determinants of Health   Financial Resource Strain: Not on file  Food Insecurity: Not on file  Transportation Needs: Not on file  Physical Activity: Not on file  Stress: Not on file  Social Connections: Not on file  Intimate Partner Violence: Not on file    No family history on file.  Allergies  Allergen Reactions   Lactose Intolerance (Gi) Cough    Review of Systems  Constitutional:  Positive for weight loss.  HENT: Negative.    Eyes: Negative.   Respiratory: Negative.    Cardiovascular: Negative.   Gastrointestinal:  Negative.   Genitourinary: Negative.   Skin: Negative.   Neurological: Negative.   Endo/Heme/Allergies: Negative.        Objective:   BP 122/69   Pulse 63   Wt 277 lb 6.4 oz (125.8 kg)   SpO2 97%   BMI 38.69 kg/m   Vitals:   05/10/23 1103  BP: 122/69  Pulse: 63  Weight: 277 lb 6.4 oz (125.8 kg)  SpO2: 97%    Physical Exam Vitals reviewed.  Constitutional:      Appearance: Normal appearance. He is obese.  HENT:     Head: Normocephalic.     Left Ear: There is no impacted cerumen.     Nose: Nose normal.     Mouth/Throat:     Mouth: Mucous membranes are moist.     Pharynx: No posterior oropharyngeal erythema.  Eyes:     Extraocular Movements: Extraocular movements intact.     Pupils: Pupils are equal, round, and reactive to light.  Cardiovascular:     Rate and Rhythm: Regular rhythm.     Chest Wall: PMI is not displaced.     Pulses: Normal pulses.     Heart sounds: Normal heart sounds. No murmur heard. Pulmonary:     Effort: Pulmonary effort is normal.     Breath sounds: Normal air entry. No rhonchi or rales.  Abdominal:     General: Abdomen is flat. Bowel sounds are normal. There is no distension.     Palpations: Abdomen is soft. There is no hepatomegaly, splenomegaly or mass.     Tenderness: There is no abdominal tenderness.  Musculoskeletal:        General: Normal range of motion.     Cervical back: Normal range of motion and neck supple.     Right lower leg: No edema.     Left lower leg: No edema.  Skin:    General: Skin is warm and dry.  Neurological:     General: No focal deficit present.     Mental Status: He is alert and oriented to person, place, and time.     Cranial Nerves: No cranial nerve deficit.     Motor: No weakness.  Psychiatric:        Mood and Affect: Mood normal.        Behavior: Behavior normal.      Results for orders placed or performed in visit on 05/10/23  POCT CBG (Fasting - Glucose)  Result Value Ref Range   Glucose  Fasting, POC 93 70 - 99 mg/dL    Recent Results (from the past 2160 hour([facility]row Drab))  TSH     Status: None   Collection Time: 02/22/23 10:47 AM  Result Value Ref Range   TSH 1.300 0.450 - 4.500 uIU/mL  Testosterone Free with SHBG     Status: None   Collection Time: 02/22/23 10:47 AM  Result Value Ref Range   Testosterone, Serum (Total) 311 ng/dL    Comment: This test was developed and its performance characteristics determined by [facility]. It has not been cleared or approved by the Food and Drug Administration. Reference Range: Adult Males >18 years    67 - 916 This [facility] LC/MS-MS method is currently certified by the [facility] Hormone Standardization Program (HoST).  Adult male reference interval is based on a population of healthy nonobese males (BMI <30) between 51 and 44 years old. Mardee Postin 4098,119;1478-2956 PMID: [id number].    % Free Testosterone 1.7 %    Comment: This test was developed and its performance characteristics determined by [facility]. It has not been cleared or approved by the Food and Drug Administration. Reference Range: Adult Males: 1.5 - 3.2    Free Testosterone, Alesandra Smart 53 pg/mL    Comment: Reference Range: Adult Males: 63 - 280    Sex Hormone Binding Globulin 34.8 nmol/L    Comment: Reference Range: >49y: 19.3 - 76.4   Lipid panel     Status: Abnormal   Collection Time: 02/22/23 10:47 AM  Result Value Ref Range   Cholesterol, Total 183 100 - 199 mg/dL   Triglycerides 63 0 - 149 mg/dL   HDL 49 >84 mg/dL   VLDL Cholesterol Cal 12 5 - 40 mg/dL   LDL Chol Calc (NIH) 696 (H) 0 - 99 mg/dL   Chol/HDL Ratio 3.7 0.0 - 5.0 ratio    Comment:                                   T. Chol/HDL Ratio                                             Men  Women  1/2 Avg.Risk  3.4    3.3                                   Avg.Risk  5.0    4.4                                2X Avg.Risk  9.6    7.1                                3X Avg.Risk 23.4    11.0   Comprehensive metabolic panel     Status: Abnormal   Collection Time: 02/22/23 10:47 AM  Result Value Ref Range   Glucose 130 (H) 70 - 99 mg/dL   BUN 17 6 - 24 mg/dL   Creatinine, Ser 8.29 0.76 - 1.27 mg/dL   eGFR 72 >56 OZ/HYQ/6.57   BUN/Creatinine Ratio 14 9 - 20   Sodium 139 134 - 144 mmol/L   Potassium 4.6 3.5 - 5.2 mmol/L   Chloride 103 96 - 106 mmol/L   CO2 22 20 - 29 mmol/L   Calcium 9.1 8.7 - 10.2 mg/dL   Total Protein 7.1 6.0 - 8.5 g/dL   Albumin 4.4 3.8 - 4.9 g/dL   Globulin, Total 2.7 1.5 - 4.5 g/dL   Albumin/Globulin Ratio 1.6 1.2 - 2.2   Bilirubin Total 0.4 0.0 - 1.2 mg/dL   Alkaline Phosphatase 96 44 - 121 IU/L   AST 26 0 - 40 IU/L   ALT 34 0 - 44 IU/L  Hemoglobin A1c     Status: Abnormal   Collection Time: 03/29/23  3:32 PM  Result Value Ref Range   Hgb A1c MFr Bld 7.1 (H) 4.8 - 5.6 %    Comment:          Prediabetes: 5.7 - 6.4          Diabetes: >6.4          Glycemic control for adults with diabetes: <7.0    Est. average glucose Bld gHb Est-mCnc 157 mg/dL  Fructosamine     Status: None   Collection Time: 05/03/23 10:24 AM  Result Value Ref Range   Fructosamine 224 0 - 285 umol/L    Comment: Published reference interval for apparently healthy subjects between age 57 and 12 is 48 - 285 umol/L and in a poorly controlled diabetic population is 228 - 563 umol/L with a mean of 396 umol/L.   POCT CBG (Fasting - Glucose)     Status: None   Collection Time: 05/10/23 11:15 AM  Result Value Ref Range   Glucose Fasting, POC 93 70 - 99 mg/dL      Assessment & Plan:  As per problem list. Problem List Items Addressed This Visit       Endocrine   Controlled type 2 diabetes mellitus without complication, without long-term current use of insulin (HCC) - Primary   Relevant Medications   Semaglutide, 1 MG/DOSE, 4 MG/3ML SOPN (Start on 05/31/2023)   metFORMIN (GLUCOPHAGE-XR) 500 MG 24 hr tablet   Other Relevant Orders   POCT CBG (Fasting - Glucose)  (Completed)   Lipid panel   Hemoglobin A1c   Comprehensive metabolic panel   Other Visit Diagnoses     Type 2 diabetes mellitus without complication, without long-term  current use of insulin (HCC)       Relevant Medications   Semaglutide, 1 MG/DOSE, 4 MG/3ML SOPN (Start on 05/31/2023)   metFORMIN (GLUCOPHAGE-XR) 500 MG 24 hr tablet       Return in about 6 weeks (around 06/21/2023) for fu with labs prior.   Total time spent: 20 minutes  Luna Fuse, MD  05/10/2023   This document may have been prepared by [facility] Voice Recognition software and as such may include unintentional dictation errors.

## 2023-05-30 ENCOUNTER — Ambulatory Visit: Payer: BC Managed Care – PPO | Admitting: Internal Medicine

## 2023-06-06 ENCOUNTER — Encounter: Payer: Self-pay | Admitting: Dietician

## 2023-06-06 ENCOUNTER — Encounter: Payer: BC Managed Care – PPO | Attending: Internal Medicine | Admitting: Dietician

## 2023-06-06 DIAGNOSIS — Z713 Dietary counseling and surveillance: Secondary | ICD-10-CM | POA: Diagnosis not present

## 2023-06-06 DIAGNOSIS — E119 Type 2 diabetes mellitus without complications: Secondary | ICD-10-CM | POA: Diagnosis present

## 2023-06-06 NOTE — Progress Notes (Signed)
Visit Type: Follow-up  Appt. Start Time: 1405 Appt. End Time: 1450  06/06/2023  [name], identified by name and date of birth, is a 52 y.o. male with a diagnosis of Diabetes:  .   ASSESSMENT  There were no vitals taken for this visit. There is no height or weight on file to calculate BMI.  Pt reports improved Fructosamine level (04/29/2023 - 224 umol/L, desired range is 205 - 280 umol/L) indicating well controlled DM, equivalent to A1c of 5.4% Pt reports losing ~35 lbs (300 to 265 lbs in months), states their legs and feet don't hurt as much, feels better overall. Pt reports they have been making pizza with low carb wraps, marinara sauce, less cheese. Pt reports eating more vegetables, sugar free/fat free condiments  Pt reports using vanilla protein in their coffee and eats oatmeal or grits with fruit. Pt reports having greek yogurt and low-fat popcorn for snacks. Pt reports riding 11 miles in ~35 minutes on the stationary bikes nearly every day of the week. Pt reports slight decline in sleep quality after losing weight due to not finding the appropriate number on their sleep number bed, still sleeps >6 hours nightly.  Diabetes Self-Management Education   Diabetes Self-Management Education - 06/06/23 1510       Visit Information   Visit Type Follow-up      Psychosocial Assessment   Learning Readiness Change in progress      Pre-Education Assessment   Patient understands the diabetes disease and treatment process. Comprehends key points    Patient understands incorporating nutritional management into lifestyle. Comprehends key points    Patient undertands incorporating physical activity into lifestyle. Comprehends key points    Patient understands using medications safely. Comprehends key points    Patient understands monitoring blood glucose, interpreting and using results N/A (comment)    Patient understands prevention, detection, and treatment of acute  complications. N/A (comment)    Patient understands prevention, detection, and treatment of chronic complications. Compreheands key points    Patient understands how to develop strategies to address psychosocial issues. Comprehends key points    Patient understands how to develop strategies to promote health/change behavior. Comprehends key points      Complications   Last HgB A1C per patient/outside source 5.4 %   04/29/2023, Fructosamine equivalnet (224 umol/L)     Dietary Intake   Breakfast grits, apple, coffee w/ vanilla protein powder    Lunch 2 tortilla pizzas, low-fat ground beef    Dinner Sirloin strips, peppers, onions, zucchini, cauliflower rice    Beverage(s) Unsweet tea w/ Splenda, water (64 oz)      Activity / Exercise   Activity / Exercise Type ADL's;Moderate (swimming / aerobic walking)    How many days per week do you exercise? 7    How many minutes per day do you exercise? 35    Total minutes per week of exercise 245      Patient Education   Healthy Eating Other (comment)   Mindful Eating   Being Active Helped patient identify appropriate exercises in relation to his/her diabetes, diabetes complications and other health issue.    Medications Reviewed patients medication for diabetes, action, purpose, timing of dose and side effects.    Lifestyle and Health Coping Helped patient develop diabetes management plan for (enter comment)   Habit formation when d/c Ozempic     Individualized Goals (developed by patient)   Nutrition Other (comment)   Eat slowly and deliberately   Physical  Activity Exercise 5-7 days per week    Medications take my medication as prescribed    Problem Solving Sleep Pattern      Patient Self-Evaluation of Goals - Patient rates self as meeting previously set goals (% of time)   Nutrition 50 - 75 % (half of the time)    Physical Activity >75% (most of the time)    Medications >75% (most of the time)    Monitoring Not Applicable    Problem Solving  and behavior change strategies  50 - 75 % (half of the time)    Reducing Risk (treating acute and chronic complications) >75% (most of the time)    Health Coping 50 - 75 % (half of the time)      Post-Education Assessment   Patient understands the diabetes disease and treatment process. Demonstrates understanding / competency    Patient understands incorporating nutritional management into lifestyle. Comprehends key points    Patient undertands incorporating physical activity into lifestyle. Demonstrates understanding / competency    Patient understands using medications safely. Demonstrates understanding / competency    Patient understands monitoring blood glucose, interpreting and using results N/A    Patient understands prevention, detection, and treatment of acute complications. N/A    Patient understands prevention, detection, and treatment of chronic complications. Demonstrates understanding / competency    Patient understands how to develop strategies to address psychosocial issues. Comprehends key points    Patient understands how to develop strategies to promote health/change behavior. Comprehends key points      Outcomes   Expected Outcomes Demonstrated interest in learning. Expect positive outcomes    Future DMSE 2 months    Program Status Not Completed      Subsequent Visit   Since your last visit have you continued or begun to take your medications as prescribed? Yes    Since your last visit have you had your blood pressure checked? Yes    Is your most recent blood pressure lower, unchanged, or higher since your last visit? Lower    Since your last visit have you experienced any weight changes? Loss    Weight Loss (lbs) 10    Since your last visit, are you checking your blood glucose at least once a day? N/A             Individualized Plan for Diabetes Self-Management Training:   Learning Objective:  Patient will have a greater understanding of diabetes  self-management. Patient education plan is to attend individual and/or group sessions per assessed needs and concerns.   Plan:   Patient Instructions  Congrats on all of your success so far! Continue to focus on the positive changes in quality of life (leg pain, foot pain) you are seeing!  Work on beginning to practice mindful eating! Pay more attention to how your food tastes, smells, feels, looks, etc.  Take one bite of your food and put your utensil down. Chew that bite completely and swallow before picking your utensil and having another bite. Make a deliberate attempt to practice this to slow down your meals. This is a behavior we want to develop for the rest of your life!  Expected Outcomes:  Demonstrated interest in learning. Expect positive outcomes  If problems or questions, patient to contact team via:  Phone and Email  Future DSME appointment: 2 months

## 2023-06-06 NOTE — Patient Instructions (Addendum)
Congrats on all of your success so far! Continue to focus on the positive changes in quality of life (leg pain, foot pain) you are seeing!  Work on beginning to practice mindful eating! Pay more attention to how your food tastes, smells, feels, looks, etc.  Take one bite of your food and put your utensil down. Chew that bite completely and swallow before picking your utensil and having another bite. Make a deliberate attempt to practice this to slow down your meals. This is a behavior we want to develop for the rest of your life!

## 2023-06-15 ENCOUNTER — Other Ambulatory Visit: Payer: BC Managed Care – PPO

## 2023-06-16 LAB — HEMOGLOBIN A1C
Est. average glucose Bld gHb Est-mCnc: 108 mg/dL
Hgb A1c MFr Bld: 5.4 % (ref 4.8–5.6)

## 2023-06-21 ENCOUNTER — Ambulatory Visit: Payer: BC Managed Care – PPO | Admitting: Internal Medicine

## 2023-06-21 ENCOUNTER — Encounter: Payer: Self-pay | Admitting: Internal Medicine

## 2023-06-21 VITALS — BP 125/89 | HR 70 | Ht 70.0 in | Wt 259.8 lb

## 2023-06-21 DIAGNOSIS — E119 Type 2 diabetes mellitus without complications: Secondary | ICD-10-CM

## 2023-06-21 DIAGNOSIS — Z6841 Body Mass Index (BMI) 40.0 and over, adult: Secondary | ICD-10-CM

## 2023-06-21 DIAGNOSIS — R42 Dizziness and giddiness: Secondary | ICD-10-CM | POA: Diagnosis not present

## 2023-06-21 LAB — POCT CBG (FASTING - GLUCOSE)-MANUAL ENTRY: Glucose Fasting, POC: 89 mg/dL (ref 70–99)

## 2023-06-21 MED ORDER — METFORMIN HCL ER 500 MG PO TB24: 500.0000 mg | ORAL_TABLET | Freq: Every day | ORAL | 1 refills | Status: DC

## 2023-06-21 NOTE — Progress Notes (Signed)
Established Patient Office Visit  Subjective:  Patient ID: Jose Tande., male    DOB: 08/30/1971  Age: 52 y.o. MRN: [id number]  Chief Complaint  Patient presents with   Follow-up    6 week f/u with lab results    C/o postural dizziness x 1-2 months. Denies any LOC and also here for lab review and medication refills. Labs reviewed and notable for well controlled diabetes, A1c at target and denies any hypoglycemic episodes and home bg readings have been at target.   No other concerns at this time.   Past Medical History:  Diagnosis Date   Biceps tendon rupture, right, subsequent encounter    Hypertension     Past Surgical History:  Procedure Laterality Date   COLONOSCOPY WITH PROPOFOL N/A 08/10/2015   Procedure: COLONOSCOPY WITH PROPOFOL;  Surgeon: Wallace Cullens, MD;  Location: [facility] ENDOSCOPY;  Service: Gastroenterology;  Laterality: N/A;    Social History   Socioeconomic History   Marital status: Married    Spouse name: Not on file   Number of children: Not on file   Years of education: Not on file   Highest education level: Not on file  Occupational History   Not on file  Tobacco Use   Smoking status: Never   Smokeless tobacco: Never  Substance and Sexual Activity   Alcohol use: Yes    Alcohol/week: 1.0 standard drink of alcohol    Types: 1 Cans of beer per week   Drug use: No   Sexual activity: Yes  Other Topics Concern   Not on file  Social History Narrative   Not on file   Social Determinants of Health   Financial Resource Strain: Not on file  Food Insecurity: Not on file  Transportation Needs: Not on file  Physical Activity: Not on file  Stress: Not on file  Social Connections: Not on file  Intimate Partner Violence: Not on file    No family history on file.  Allergies  Allergen Reactions   Lactose Intolerance (Gi) Cough    Review of Systems  Constitutional:  Positive for weight loss (18 lbs).  HENT: Negative.    Eyes: Negative.    Respiratory: Negative.    Cardiovascular: Negative.   Gastrointestinal: Negative.   Genitourinary: Negative.   Skin: Negative.   Neurological: Negative.   Endo/Heme/Allergies: Negative.        Objective:   BP 125/89   Pulse 70   Ht 5\' 10"  (1.778 m)   Wt 259 lb 12.8 oz (117.8 kg)   SpO2 98%   BMI 37.28 kg/m   Vitals:   06/21/23 1053  BP: 125/89  Pulse: 70  Height: 5\' 10"  (1.778 m)  Weight: 259 lb 12.8 oz (117.8 kg)  SpO2: 98%  BMI (Calculated): 37.28    Physical Exam Vitals reviewed.  Constitutional:      Appearance: Normal appearance. He is obese.  HENT:     Head: Normocephalic.     Left Ear: There is no impacted cerumen.     Nose: Nose normal.     Mouth/Throat:     Mouth: Mucous membranes are moist.     Pharynx: No posterior oropharyngeal erythema.  Eyes:     Extraocular Movements: Extraocular movements intact.     Pupils: Pupils are equal, round, and reactive to light.  Cardiovascular:     Rate and Rhythm: Regular rhythm.     Chest Wall: PMI is not displaced.     Pulses: Normal pulses.  Heart sounds: Normal heart sounds. No murmur heard. Pulmonary:     Effort: Pulmonary effort is normal.     Breath sounds: Normal air entry. No rhonchi or rales.  Abdominal:     General: Abdomen is flat. Bowel sounds are normal. There is no distension.     Palpations: Abdomen is soft. There is no hepatomegaly, splenomegaly or mass.     Tenderness: There is no abdominal tenderness.  Musculoskeletal:        General: Normal range of motion.     Cervical back: Normal range of motion and neck supple.     Right lower leg: No edema.     Left lower leg: No edema.  Skin:    General: Skin is warm and dry.  Neurological:     General: No focal deficit present.     Mental Status: He is alert and oriented to person, place, and time.     Cranial Nerves: No cranial nerve deficit.     Motor: No weakness.  Psychiatric:        Mood and Affect: Mood normal.        Behavior:  Behavior normal.      Results for orders placed or performed in visit on 06/21/23  POCT CBG (Fasting - Glucose)  Result Value Ref Range   Glucose Fasting, POC 89 70 - 99 mg/dL    Recent Results (from the past 2160 hour(Anguel Delapena))  Hemoglobin A1c     Status: Abnormal   Collection Time: 03/29/23  3:32 PM  Result Value Ref Range   Hgb A1c MFr Bld 7.1 (H) 4.8 - 5.6 %    Comment:          Prediabetes: 5.7 - 6.4          Diabetes: >6.4          Glycemic control for adults with diabetes: <7.0    Est. average glucose Bld gHb Est-mCnc 157 mg/dL  Fructosamine     Status: None   Collection Time: 05/03/23 10:24 AM  Result Value Ref Range   Fructosamine 224 0 - 285 umol/L    Comment: Published reference interval for apparently healthy subjects between age 45 and 27 is 29 - 285 umol/L and in a poorly controlled diabetic population is 228 - 563 umol/L with a mean of 396 umol/L.   POCT CBG (Fasting - Glucose)     Status: None   Collection Time: 05/10/23 11:15 AM  Result Value Ref Range   Glucose Fasting, POC 93 70 - 99 mg/dL  Hemoglobin Q6V     Status: None   Collection Time: 06/15/23  8:36 AM  Result Value Ref Range   Hgb A1c MFr Bld 5.4 4.8 - 5.6 %    Comment:          Prediabetes: 5.7 - 6.4          Diabetes: >6.4          Glycemic control for adults with diabetes: <7.0    Est. average glucose Bld gHb Est-mCnc 108 mg/dL  POCT CBG (Fasting - Glucose)     Status: None   Collection Time: 06/21/23 10:58 AM  Result Value Ref Range   Glucose Fasting, POC 89 70 - 99 mg/dL      Assessment & Plan:  As per problem list. Stricter low calorie diet, low cholesterol and low fat diet and exercise as much as possible. Increase oral fluid intake but if symptoms persist, return for IVF.  Problem  List Items Addressed This Visit       Endocrine   Controlled type 2 diabetes mellitus without complication, without long-term current use of insulin (HCC)   Relevant Medications   metFORMIN  (GLUCOPHAGE-XR) 500 MG 24 hr tablet     Other   Morbid obesity with BMI of 40.0-44.9, adult (HCC)   Relevant Medications   metFORMIN (GLUCOPHAGE-XR) 500 MG 24 hr tablet   Other Visit Diagnoses     Type 2 diabetes mellitus without complication, without long-term current use of insulin (HCC)    -  Primary   Relevant Medications   metFORMIN (GLUCOPHAGE-XR) 500 MG 24 hr tablet   Other Relevant Orders   POCT CBG (Fasting - Glucose) (Completed)       Return in about 3 months (around 09/21/2023) for fu with labs prior.   Total time spent: 20 minutes  Luna Fuse, MD  06/21/2023   This document may have been prepared by [facility] Voice Recognition software and as such may include unintentional dictation errors.

## 2023-07-29 ENCOUNTER — Other Ambulatory Visit: Payer: Self-pay | Admitting: Internal Medicine

## 2023-07-29 DIAGNOSIS — E119 Type 2 diabetes mellitus without complications: Secondary | ICD-10-CM

## 2023-08-01 ENCOUNTER — Encounter: Payer: Self-pay | Admitting: Dietician

## 2023-08-01 ENCOUNTER — Ambulatory Visit: Payer: 59 | Admitting: Dietician

## 2023-08-01 ENCOUNTER — Encounter: Payer: BC Managed Care – PPO | Attending: Internal Medicine | Admitting: Dietician

## 2023-08-01 DIAGNOSIS — E119 Type 2 diabetes mellitus without complications: Secondary | ICD-10-CM | POA: Insufficient documentation

## 2023-08-01 NOTE — Patient Instructions (Addendum)
CONGRATS ON ALL OF YOUR CONTINUING SUCCESS!!! KEEP UP THE GREAT WORK!!  Check your weight on Monday morning each week, and no more often than once a week. Look for weight loss of 1-2 lbs per week.  Purchase a single serving a candy of choice and put it away in your house somewhere you usually don't keep food. Work on controlling the impulse to eat it and allow yourself to eat the candy after a couple days.   Continue to choose low carb wraps for your personal pizzas!!

## 2023-08-01 NOTE — Progress Notes (Signed)
Diabetes Self-Management Education  Visit Type: Follow-up  Appt. Start Time: 1445 Appt. End Time: 1540  08/01/2023  Jose Paul, identified by name and date of birth, is a 52 y.o. male with a diagnosis of Diabetes:  .   ASSESSMENT  There were no vitals taken for this visit. There is no height or weight on file to calculate BMI.  Pt reports continuing 0.5 mg Ozempic, Metformin, no side effects, continued appetite suppression. Pt reports continuing to lose weight but the rate is slowing more recently. Pt reports a goal of 230 lbs to be able to skydive in the Spring of 2025.  Pt reports decline in sleep quality continuing as they have lost weight, needing to adjust bed to get more comfortable. Pt reports working out in the morning 5x a week (stationary bike) for 30 minutes, breaks a good sweat, drinking >128 oz of water, drinks Gatorade ZERO after workout. Pt reports a bout of eating a lot of Halloween candy for ~2 weeks, states it was because there was an abundance in the house.   Diabetes Self-Management Education - 08/01/23 1500       Visit Information   Visit Type Follow-up      Pre-Education Assessment   Patient understands the diabetes disease and treatment process. Comprehends key points    Patient understands incorporating nutritional management into lifestyle. Needs Review    Patient undertands incorporating physical activity into lifestyle. Demonstrates understanding / competency    Patient understands using medications safely. Demonstrates understanding / competency    Patient understands monitoring blood glucose, interpreting and using results N/A (comment)    Patient understands prevention, detection, and treatment of acute complications. N/A (comment)    Patient understands prevention, detection, and treatment of chronic complications. Compreheands key points    Patient understands how to develop strategies to address psychosocial issues. Comprehends key points     Patient understands how to develop strategies to promote health/change behavior. Comprehends key points      Complications   Last HgB A1C per patient/outside source 5.4 %   06/15/2023     Dietary Intake   Breakfast Protein shake, black coffee, 1 apple    Lunch Personal Pizza    Dinner Pepper steak, green beans, potatoes au gratin, 1 apple    Beverage(s) Kiwi-Strawberry Circul water      Activity / Exercise   Activity / Exercise Type ADL's;Moderate (swimming / aerobic walking)    How many days per week do you exercise? 5    How many minutes per day do you exercise? 60    Total minutes per week of exercise 300      Patient Education   Healthy Eating Meal options for control of blood glucose level and chronic complications.    Being Active Helped patient identify appropriate exercises in relation to his/her diabetes, diabetes complications and other health issue.    Medications Reviewed patients medication for diabetes, action, purpose, timing of dose and side effects.    Chronic complications Relationship between chronic complications and blood glucose control    Diabetes Stress and Support Identified and addressed patients feelings and concerns about diabetes    Lifestyle and Health Coping Lifestyle issues that need to be addressed for better diabetes care   RELATIONSHIP WITH SWEETS/IMPULSIVE EATING     Individualized Goals (developed by patient)   Nutrition General guidelines for healthy choices and portions discussed    Physical Activity Exercise 5-7 days per week    Medications take my  medication as prescribed    Problem Solving Sleep Pattern;Addressing barriers to behavior change   Relationship with food   Health Coping Ask for help with psychological, social, or emotional issues      Patient Self-Evaluation of Goals - Patient rates self as meeting previously set goals (% of time)   Nutrition 50 - 75 % (half of the time)    Physical Activity >75% (most of the time)     Medications >75% (most of the time)    Monitoring Not Applicable    Problem Solving and behavior change strategies  50 - 75 % (half of the time)    Reducing Risk (treating acute and chronic complications) >75% (most of the time)    Health Coping 50 - 75 % (half of the time)      Post-Education Assessment   Patient understands the diabetes disease and treatment process. Demonstrates understanding / competency    Patient understands incorporating nutritional management into lifestyle. Comprehends key points    Patient undertands incorporating physical activity into lifestyle. Demonstrates understanding / competency    Patient understands using medications safely. Demonstrates understanding / competency    Patient understands monitoring blood glucose, interpreting and using results N/A    Patient understands prevention, detection, and treatment of acute complications. N/A    Patient understands prevention, detection, and treatment of chronic complications. Demonstrates understanding / competency    Patient understands how to develop strategies to address psychosocial issues. Comprehends key points    Patient understands how to develop strategies to promote health/change behavior. Comprehends key points      Outcomes   Expected Outcomes Demonstrated interest in learning. Expect positive outcomes    Future DMSE 2 months    Program Status Not Completed      Subsequent Visit   Since your last visit have you continued or begun to take your medications as prescribed? Yes    Since your last visit have you had your blood pressure checked? Yes    Is your most recent blood pressure lower, unchanged, or higher since your last visit? Higher    Since your last visit have you experienced any weight changes? Loss    Weight Loss (lbs) 5    Since your last visit, are you checking your blood glucose at least once a day? N/A             Individualized Plan for Diabetes Self-Management Training:    Learning Objective:  Patient will have a greater understanding of diabetes self-management. Patient education plan is to attend individual and/or group sessions per assessed needs and concerns.   Plan:   Patient Instructions  CONGRATS ON ALL OF YOUR CONTINUING SUCCESS!!! KEEP UP THE GREAT WORK!!  Check your weight on Monday morning each week, and no more often than once a week. Look for weight loss of 1-2 lbs per week.  Purchase a single serving a candy of choice and put it away in your house somewhere you usually don't keep food. Work on controlling the impulse to eat it and allow yourself to eat the candy after a couple days.   Continue to choose low carb wraps for your personal pizzas!!      Expected Outcomes:  Demonstrated interest in learning. Expect positive outcomes   If problems or questions, patient to contact team via:  Phone and Email  Future DSME appointment: 2 months

## 2023-09-25 ENCOUNTER — Other Ambulatory Visit: Payer: BC Managed Care – PPO

## 2023-09-26 LAB — COMPREHENSIVE METABOLIC PANEL
ALT: 16 [IU]/L (ref 0–44)
AST: 17 [IU]/L (ref 0–40)
Albumin: 4.4 g/dL (ref 3.8–4.9)
Alkaline Phosphatase: 83 [IU]/L (ref 44–121)
BUN/Creatinine Ratio: 12 (ref 9–20)
BUN: 13 mg/dL (ref 6–24)
Bilirubin Total: 0.7 mg/dL (ref 0.0–1.2)
CO2: 22 mmol/L (ref 20–29)
Calcium: 8.9 mg/dL (ref 8.7–10.2)
Chloride: 101 mmol/L (ref 96–106)
Creatinine, Ser: 1.05 mg/dL (ref 0.76–1.27)
Globulin, Total: 2.4 g/dL (ref 1.5–4.5)
Glucose: 81 mg/dL (ref 70–99)
Potassium: 4.6 mmol/L (ref 3.5–5.2)
Sodium: 142 mmol/L (ref 134–144)
Total Protein: 6.8 g/dL (ref 6.0–8.5)
eGFR: 85 mL/min/{1.73_m2} (ref >59)

## 2023-09-26 LAB — LIPID PANEL
Chol/HDL Ratio: 3.5 {ratio} (ref 0.0–5.0)
Cholesterol, Total: 159 mg/dL (ref 100–199)
HDL: 46 mg/dL (ref >39)
LDL Chol Calc (NIH): 99 mg/dL (ref 0–99)
Triglycerides: 72 mg/dL (ref 0–149)
VLDL Cholesterol Cal: 14 mg/dL (ref 5–40)

## 2023-09-26 LAB — HEMOGLOBIN A1C
Est. average glucose Bld gHb Est-mCnc: 105 mg/dL
Hgb A1c MFr Bld: 5.3 % (ref 4.8–5.6)

## 2023-09-29 ENCOUNTER — Other Ambulatory Visit: Payer: Self-pay

## 2023-09-29 ENCOUNTER — Ambulatory Visit: Payer: BC Managed Care – PPO | Admitting: Internal Medicine

## 2023-09-29 ENCOUNTER — Encounter: Payer: Self-pay | Admitting: Internal Medicine

## 2023-09-29 VITALS — BP 122/73 | HR 77 | Ht 70.0 in | Wt 246.0 lb

## 2023-09-29 DIAGNOSIS — E119 Type 2 diabetes mellitus without complications: Secondary | ICD-10-CM | POA: Diagnosis not present

## 2023-09-29 DIAGNOSIS — K219 Gastro-esophageal reflux disease without esophagitis: Secondary | ICD-10-CM | POA: Diagnosis not present

## 2023-09-29 DIAGNOSIS — Z0130 Encounter for examination of blood pressure without abnormal findings: Secondary | ICD-10-CM

## 2023-09-29 LAB — POC CREATINE & ALBUMIN,URINE
Albumin/Creatinine Ratio, Urine, POC: <30
Creatinine, POC: 300 mg/dL
Microalbumin Ur, POC: 80 mg/L

## 2023-09-29 LAB — POCT CBG (FASTING - GLUCOSE)-MANUAL ENTRY: Glucose Fasting, POC: 134 mg/dL — AB (ref 70–99)

## 2023-09-29 MED ORDER — PANTOPRAZOLE SODIUM 20 MG PO TBEC: 20.0000 mg | DELAYED_RELEASE_TABLET | Freq: Every day | ORAL | 2 refills | Status: DC

## 2023-09-29 MED ORDER — OZEMPIC (2 MG/DOSE) 8 MG/3ML ~~LOC~~ SOPN: 2.0000 mg | PEN_INJECTOR | SUBCUTANEOUS | 2 refills | Status: DC

## 2023-09-29 MED ORDER — METFORMIN HCL ER 500 MG PO TB24: 500.0000 mg | ORAL_TABLET | Freq: Every day | ORAL | 1 refills | Status: DC

## 2023-09-29 NOTE — Progress Notes (Signed)
 Established Patient Office Visit  Subjective:  Patient ID: Jose Paul., male    DOB: Mar 27, 1971  Age: 53 y.o. MRN: [id number]  Chief Complaint  Patient presents with  . Follow-up    C/o onset of acid reflux with dyspepsia and waterbrash, also here for lab review and medication refills. Labs reviewed and notable for well controlled diabetes, A1c at target, lipids at target with unremarkable cmp. Denies any hypoglycemic episodes and home bg readings have been at target. Lost additional 13 lbs with diet and exercise on GLP-1.   No other concerns at this time.   Past Medical History:  Diagnosis Date  . Biceps tendon rupture, right, subsequent encounter   . Hypertension     Past Surgical History:  Procedure Laterality Date  . COLONOSCOPY WITH PROPOFOL  N/A 08/10/2015   Procedure: COLONOSCOPY WITH PROPOFOL ;  Surgeon: Deward CINDERELLA Piedmont, MD;  Location: [facility] ENDOSCOPY;  Service: Gastroenterology;  Laterality: N/A;    Social History   Socioeconomic History  . Marital status: Married    Spouse name: Not on file  . Number of children: Not on file  . Years of education: Not on file  . Highest education level: Not on file  Occupational History  . Not on file  Tobacco Use  . Smoking status: Never  . Smokeless tobacco: Never  Substance and Sexual Activity  . Alcohol use: Yes    Alcohol/week: 1.0 standard drink of alcohol    Types: 1 Cans of beer per week  . Drug use: No  . Sexual activity: Yes  Other Topics Concern  . Not on file  Social History Narrative  . Not on file   Social Drivers of Health   Financial Resource Strain: Not on file  Food Insecurity: Not on file  Transportation Needs: Not on file  Physical Activity: Not on file  Stress: Not on file  Social Connections: Not on file  Intimate Partner Violence: Not on file    No family history on file.  Allergies  Allergen Reactions  . Lactose Intolerance (Gi) Cough    Outpatient Medications Prior to Visit   Medication Sig  . cetirizine (ZYRTEC) 10 MG tablet Take 10 mg by mouth daily.  . metFORMIN  (GLUCOPHAGE -XR) 500 MG 24 hr tablet Take 1 tablet (500 mg total) by mouth daily with breakfast.  . [DISCONTINUED] OZEMPIC , 1 MG/DOSE, 4 MG/3ML SOPN INJECT 1 MG UNDER THE SKIN ONCE A WEEK.   No facility-administered medications prior to visit.    Review of Systems  Constitutional:  Positive for weight loss (13 lbs).  HENT: Negative.    Eyes: Negative.   Respiratory: Negative.    Cardiovascular: Negative.   Gastrointestinal:  Positive for heartburn. Negative for nausea and vomiting.  Genitourinary: Negative.   Skin: Negative.   Neurological: Negative.   Endo/Heme/Allergies: Negative.        Objective:   BP 122/73   Pulse 77   Ht 5' 10 (1.778 m)   Wt 246 lb (111.6 kg)   SpO2 98%   BMI 35.30 kg/m   Vitals:   09/29/23 0957  BP: 122/73  Pulse: 77  Height: 5' 10 (1.778 m)  Weight: 246 lb (111.6 kg)  SpO2: 98%  BMI (Calculated): 35.3    Physical Exam Vitals reviewed.  Constitutional:      Appearance: Normal appearance. He is obese.  HENT:     Head: Normocephalic.     Left Ear: There is no impacted cerumen.  Nose: Nose normal.     Mouth/Throat:     Mouth: Mucous membranes are moist.     Pharynx: No posterior oropharyngeal erythema.  Eyes:     Extraocular Movements: Extraocular movements intact.     Pupils: Pupils are equal, round, and reactive to light.  Cardiovascular:     Rate and Rhythm: Regular rhythm.     Chest Wall: PMI is not displaced.     Pulses: Normal pulses.     Heart sounds: Normal heart sounds. No murmur heard. Pulmonary:     Effort: Pulmonary effort is normal.     Breath sounds: Normal air entry. No rhonchi or rales.  Abdominal:     General: Abdomen is flat. Bowel sounds are normal. There is no distension.     Palpations: Abdomen is soft. There is no hepatomegaly, splenomegaly or mass.     Tenderness: There is no abdominal tenderness.   Musculoskeletal:        General: Normal range of motion.     Cervical back: Normal range of motion and neck supple.     Right lower leg: No edema.     Left lower leg: No edema.  Skin:    General: Skin is warm and dry.  Neurological:     General: No focal deficit present.     Mental Status: He is alert and oriented to person, place, and time.     Cranial Nerves: No cranial nerve deficit.     Motor: No weakness.  Psychiatric:        Mood and Affect: Mood normal.        Behavior: Behavior normal.     Results for orders placed or performed in visit on 09/29/23  POCT CBG (Fasting - Glucose)  Result Value Ref Range   Glucose Fasting, POC 134 (A) 70 - 99 mg/dL    Recent Results (from the past 2160 hours)  Comprehensive metabolic panel     Status: None   Collection Time: 09/25/23  8:23 AM  Result Value Ref Range   Glucose 81 70 - 99 mg/dL   BUN 13 6 - 24 mg/dL   Creatinine, Ser 8.94 0.76 - 1.27 mg/dL   eGFR 85 >40 fO/fpw/8.26   BUN/Creatinine Ratio 12 9 - 20   Sodium 142 134 - 144 mmol/L   Potassium 4.6 3.5 - 5.2 mmol/L   Chloride 101 96 - 106 mmol/L   CO2 22 20 - 29 mmol/L   Calcium 8.9 8.7 - 10.2 mg/dL   Total Protein 6.8 6.0 - 8.5 g/dL   Albumin 4.4 3.8 - 4.9 g/dL   Globulin, Total 2.4 1.5 - 4.5 g/dL   Bilirubin Total 0.7 0.0 - 1.2 mg/dL   Alkaline Phosphatase 83 44 - 121 IU/L   AST 17 0 - 40 IU/L   ALT 16 0 - 44 IU/L  Hemoglobin A1c     Status: None   Collection Time: 09/25/23  8:24 AM  Result Value Ref Range   Hgb A1c MFr Bld 5.3 4.8 - 5.6 %    Comment:          Prediabetes: 5.7 - 6.4          Diabetes: >6.4          Glycemic control for adults with diabetes: <7.0    Est. average glucose Bld gHb Est-mCnc 105 mg/dL  Lipid panel     Status: None   Collection Time: 09/25/23  8:24 AM  Result Value Ref Range  Cholesterol, Total 159 100 - 199 mg/dL   Triglycerides 72 0 - 149 mg/dL   HDL 46 >60 mg/dL   VLDL Cholesterol Cal 14 5 - 40 mg/dL   LDL Chol Calc (NIH) 99  0 - 99 mg/dL   Chol/HDL Ratio 3.5 0.0 - 5.0 ratio    Comment:                                   T. Chol/HDL Ratio                                             Men  Women                               1/2 Avg.Risk  3.4    3.3                                   Avg.Risk  5.0    4.4                                2X Avg.Risk  9.6    7.1                                3X Avg.Risk 23.4   11.0   POCT CBG (Fasting - Glucose)     Status: Abnormal   Collection Time: 09/29/23 10:05 AM  Result Value Ref Range   Glucose Fasting, POC 134 (A) 70 - 99 mg/dL      Assessment & Plan:  As per problem list  Problem List Items Addressed This Visit   None Visit Diagnoses       Type 2 diabetes mellitus without complication, without long-term current use of insulin (HCC)    -  Primary   Relevant Medications   Semaglutide , 2 MG/DOSE, (OZEMPIC , 2 MG/DOSE,) 8 MG/3ML SOPN   Other Relevant Orders   POCT CBG (Fasting - Glucose) (Completed)   POC CREATINE & ALBUMIN,URINE   Hemoglobin A1c   Lipid panel     Gastroesophageal reflux disease without esophagitis       Relevant Medications   pantoprazole  (PROTONIX ) 20 MG tablet   Other Relevant Orders   H. pylori breath test       Return in about 3 months (around 12/28/2023) for fu with labs prior.   Total time spent: 30 minutes  Sherrill Cinderella Perry, MD  09/29/2023   This document may have been prepared by [facility] Voice Recognition software and as such may include unintentional dictation errors.

## 2023-10-06 ENCOUNTER — Encounter: Payer: Self-pay | Admitting: Dietician

## 2023-10-06 ENCOUNTER — Encounter: Payer: BC Managed Care – PPO | Attending: Internal Medicine | Admitting: Dietician

## 2023-10-06 DIAGNOSIS — E119 Type 2 diabetes mellitus without complications: Secondary | ICD-10-CM | POA: Diagnosis present

## 2023-10-06 NOTE — Progress Notes (Signed)
Diabetes Self-Management Education  Visit Type: Follow-up  Appt. Start Time: 0830 Appt. End Time: 0910  10/06/2023  Jose Paul, identified by name and date of birth, is a 53 y.o. male with a diagnosis of Diabetes:  .   ASSESSMENT  There were no vitals taken for this visit. There is no height or weight on file to calculate BMI.  Pt reports increasing Ozempic to 2.0 mg this week, Metformin every day, no new side effects occasional GI discomfort. Pt reports starting Pantoprazole for GERD, reports noticeable relief from this medication. Pt reports increasing exercise, 7 days a week, now up to 15 miles a day on stationary bike [facility]), walks 3 miles on a 2-3% incline. Pt reports continuing weight loss, ~15 lbs in the last 3 months. Pt reports having a spoon of peanut butter, or a couple cookies occasionally to answer cravings for sweets, pt states they are in a better place with impulse control.    Diabetes Self-Management Education - 10/06/23 0900       Visit Information   Visit Type Follow-up      Pre-Education Assessment   Patient understands the diabetes disease and treatment process. Demonstrates understanding / competency    Patient understands incorporating nutritional management into lifestyle. Comprehends key points    Patient undertands incorporating physical activity into lifestyle. Demonstrates understanding / competency    Patient understands using medications safely. Demonstrates understanding / competency    Patient understands monitoring blood glucose, interpreting and using results N/A (comment)    Patient understands prevention, detection, and treatment of acute complications. N/A (comment)    Patient understands prevention, detection, and treatment of chronic complications. Demonstrates understanding / competency    Patient understands how to develop strategies to address psychosocial issues. Comprehends key points    Patient understands how to develop  strategies to promote health/change behavior. Comprehends key points      Complications   Last HgB A1C per patient/outside source 5.3 %   09/25/2023     Dietary Intake   Breakfast 1/2 cup grits, apple, coffee w/ Splenda and protein    Lunch Lasagna soup    Dinner Chipoltle bowl (ground beef, beans, corn, cheese, rice, queso), handful tortilla chips    Snack (evening) Hot tea w/ honey    Beverage(s) Coffee, Circul water, Hot tea      Activity / Exercise   Activity / Exercise Type Moderate (swimming / aerobic walking);ADL's    How many days per week do you exercise? 7    How many minutes per day do you exercise? 60    Total minutes per week of exercise 420      Patient Education   Healthy Eating Information on hints to eating out and maintain blood glucose control.;Meal options for control of blood glucose level and chronic complications.    Being Active Helped patient identify appropriate exercises in relation to his/her diabetes, diabetes complications and other health issue.    Medications Reviewed patients medication for diabetes, action, purpose, timing of dose and side effects.    Diabetes Stress and Support Brainstormed with patient on coping mechanisms for social situations, getting support from significant others, dealing with feelings about diabetes      Individualized Goals (developed by patient)   Nutrition Other (comment)   More nutritious substitutes for sweets   Physical Activity Exercise 5-7 days per week    Medications take my medication as prescribed      Patient Self-Evaluation of Goals - Patient rates  self as meeting previously set goals (% of time)   Nutrition >75% (most of the time)    Physical Activity >75% (most of the time)    Medications >75% (most of the time)    Monitoring Not Applicable    Problem Solving and behavior change strategies  50 - 75 % (half of the time)    Reducing Risk (treating acute and chronic complications) >75% (most of the time)     Health Coping >75% (most of the time)      Post-Education Assessment   Patient understands the diabetes disease and treatment process. Demonstrates understanding / competency    Patient understands incorporating nutritional management into lifestyle. Demonstrates understanding / competency    Patient undertands incorporating physical activity into lifestyle. Demonstrates understanding / competency    Patient understands using medications safely. Demonstrates understanding / competency    Patient understands monitoring blood glucose, interpreting and using results N/A    Patient understands prevention, detection, and treatment of acute complications. N/A    Patient understands prevention, detection, and treatment of chronic complications. Demonstrates understanding / competency    Patient understands how to develop strategies to address psychosocial issues. Demonstrates understanding / competency    Patient understands how to develop strategies to promote health/change behavior. Demonstrates understanding / competency      Outcomes   Expected Outcomes Demonstrated interest in learning. Expect positive outcomes    Future DMSE 3-4 months    Program Status Not Completed      Subsequent Visit   Since your last visit have you continued or begun to take your medications as prescribed? Yes    Since your last visit have you had your blood pressure checked? Yes    Is your most recent blood pressure lower, unchanged, or higher since your last visit? Lower    Since your last visit have you experienced any weight changes? Loss    Weight Loss (lbs) 12    Since your last visit, are you checking your blood glucose at least once a day? No             Individualized Plan for Diabetes Self-Management Training:   Learning Objective:  Patient will have a greater understanding of diabetes self-management. Patient education plan is to attend individual and/or group sessions per assessed needs and  concerns.   Plan:   Patient Instructions  Keep up the great work!! Your improvements in A1c are remarkable!  Continue your weight loss at 1-2 lbs a week.  Try having a sugar-free chocolate pudding with PB2 powdered peanut butter.   Expected Outcomes:  Demonstrated interest in learning. Expect positive outcomes  If problems or questions, patient to contact team via:  Phone and Email  Future DSME appointment: 3-4 months

## 2023-10-06 NOTE — Patient Instructions (Addendum)
Keep up the great work!! Your improvements in A1c are remarkable!  Continue your weight loss at 1-2 lbs a week.  Try having a sugar-free chocolate pudding with PB2 powdered peanut butter.

## 2023-12-20 ENCOUNTER — Other Ambulatory Visit: Payer: Self-pay | Admitting: Internal Medicine

## 2023-12-20 DIAGNOSIS — E119 Type 2 diabetes mellitus without complications: Secondary | ICD-10-CM

## 2024-01-05 ENCOUNTER — Encounter: Payer: 59 | Attending: Internal Medicine | Admitting: Dietician

## 2024-01-05 ENCOUNTER — Ambulatory Visit: Payer: BC Managed Care – PPO | Admitting: Internal Medicine

## 2024-01-16 ENCOUNTER — Other Ambulatory Visit

## 2024-01-16 DIAGNOSIS — E119 Type 2 diabetes mellitus without complications: Secondary | ICD-10-CM

## 2024-01-17 LAB — LIPID PANEL
Chol/HDL Ratio: 3.1 ratio (ref 0.0–5.0)
Cholesterol, Total: 171 mg/dL (ref 100–199)
HDL: 55 mg/dL (ref >39)
LDL Chol Calc (NIH): 107 mg/dL — ABNORMAL HIGH (ref 0–99)
Triglycerides: 44 mg/dL (ref 0–149)
VLDL Cholesterol Cal: 9 mg/dL (ref 5–40)

## 2024-01-17 LAB — HEMOGLOBIN A1C
Est. average glucose Bld gHb Est-mCnc: 100 mg/dL
Hgb A1c MFr Bld: 5.1 % (ref 4.8–5.6)

## 2024-01-19 ENCOUNTER — Encounter: Payer: Self-pay | Admitting: Internal Medicine

## 2024-01-19 ENCOUNTER — Ambulatory Visit: Admitting: Internal Medicine

## 2024-01-19 VITALS — BP 110/80 | HR 61 | Temp 97.4°F | Ht 70.0 in | Wt 242.6 lb

## 2024-01-19 DIAGNOSIS — K219 Gastro-esophageal reflux disease without esophagitis: Secondary | ICD-10-CM | POA: Diagnosis not present

## 2024-01-19 DIAGNOSIS — E119 Type 2 diabetes mellitus without complications: Secondary | ICD-10-CM | POA: Diagnosis not present

## 2024-01-19 DIAGNOSIS — E7800 Pure hypercholesterolemia, unspecified: Secondary | ICD-10-CM

## 2024-01-19 DIAGNOSIS — Z0130 Encounter for examination of blood pressure without abnormal findings: Secondary | ICD-10-CM

## 2024-01-19 DIAGNOSIS — H9313 Tinnitus, bilateral: Secondary | ICD-10-CM | POA: Diagnosis not present

## 2024-01-19 DIAGNOSIS — N400 Enlarged prostate without lower urinary tract symptoms: Secondary | ICD-10-CM

## 2024-01-19 LAB — GLUCOSE, POCT (MANUAL RESULT ENTRY): POC Glucose: 90 mg/dL (ref 70–99)

## 2024-01-19 NOTE — Progress Notes (Signed)
 Established Patient Office Visit  Subjective:  Patient ID: Jose Paul., male    DOB: 06-10-1971  Age: 53 y.o. MRN: [id number]  Chief Complaint  Patient presents with   Follow-up    3 month lab results    Only c/o tinnitus in both ears for several years. Also here for lab review and medication refills. Labs reviewed and notable for well controlled diabetes, A1c remains at target, lipids no longer at target to which he admits dietary indiscretion. Denies any hypoglycemic episodes and home bg readings have been at target. Continues to lose weight with diet and exercise.     No other concerns at this time.   Past Medical History:  Diagnosis Date   Biceps tendon rupture, right, subsequent encounter    Hypertension     Past Surgical History:  Procedure Laterality Date   COLONOSCOPY WITH PROPOFOL  N/A 08/10/2015   Procedure: COLONOSCOPY WITH PROPOFOL ;  Surgeon: Stephens Eis, MD;  Location: [facility] ENDOSCOPY;  Service: Gastroenterology;  Laterality: N/A;    Social History   Socioeconomic History   Marital status: Married    Spouse name: Not on file   Number of children: Not on file   Years of education: Not on file   Highest education level: Not on file  Occupational History   Not on file  Tobacco Use   Smoking status: Never   Smokeless tobacco: Never  Substance and Sexual Activity   Alcohol use: Yes    Alcohol/week: 1.0 standard drink of alcohol    Types: 1 Cans of beer per week   Drug use: No   Sexual activity: Yes  Other Topics Concern   Not on file  Social History Narrative   Not on file   Social Drivers of Health   Financial Resource Strain: Not on file  Food Insecurity: Not on file  Transportation Needs: Not on file  Physical Activity: Not on file  Stress: Not on file  Social Connections: Not on file  Intimate Partner Violence: Not on file    No family history on file.  Allergies  Allergen Reactions   Lactose Intolerance (Gi) Cough     Outpatient Medications Prior to Visit  Medication Sig   cetirizine (ZYRTEC) 10 MG tablet Take 10 mg by mouth daily.   metFORMIN  (GLUCOPHAGE -XR) 500 MG 24 hr tablet Take 1 tablet (500 mg total) by mouth daily with breakfast.   OZEMPIC , 2 MG/DOSE, 8 MG/3ML SOPN INJECT 2 MG UNDER THE SKIN ONCE A WEEK   pantoprazole  (PROTONIX ) 20 MG tablet Take 1 tablet (20 mg total) by mouth daily.   No facility-administered medications prior to visit.    Review of Systems  Constitutional:  Positive for weight loss (4 lbs).  HENT: Negative.    Eyes: Negative.   Respiratory: Negative.    Cardiovascular: Negative.   Gastrointestinal:  Positive for heartburn. Negative for nausea and vomiting.  Genitourinary: Negative.   Skin: Negative.   Neurological: Negative.   Endo/Heme/Allergies: Negative.        Objective:   BP 110/80   Pulse 61   Temp (!) 97.4 F (36.3 C)   Ht 5\' 10"  (1.778 m)   Wt 242 lb 9.6 oz (110 kg)   SpO2 98%   BMI 34.81 kg/m   Vitals:   01/19/24 1119  BP: 110/80  Pulse: 61  Temp: (!) 97.4 F (36.3 C)  Height: 5\' 10"  (1.778 m)  Weight: 242 lb 9.6 oz (110 kg)  SpO2: 98%  BMI (Calculated): 34.81    Physical Exam Vitals reviewed.  Constitutional:      Appearance: Normal appearance. He is obese.  HENT:     Head: Normocephalic.     Left Ear: There is no impacted cerumen.     Nose: Nose normal.     Mouth/Throat:     Mouth: Mucous membranes are moist.     Pharynx: No posterior oropharyngeal erythema.  Eyes:     Extraocular Movements: Extraocular movements intact.     Pupils: Pupils are equal, round, and reactive to light.  Cardiovascular:     Rate and Rhythm: Regular rhythm.     Chest Wall: PMI is not displaced.     Pulses: Normal pulses.     Heart sounds: Normal heart sounds. No murmur heard. Pulmonary:     Effort: Pulmonary effort is normal.     Breath sounds: Normal air entry. No rhonchi or rales.  Abdominal:     General: Abdomen is flat. Bowel sounds  are normal. There is no distension.     Palpations: Abdomen is soft. There is no hepatomegaly, splenomegaly or mass.     Tenderness: There is no abdominal tenderness.  Musculoskeletal:        General: Normal range of motion.     Cervical back: Normal range of motion and neck supple.     Right lower leg: No edema.     Left lower leg: No edema.  Skin:    General: Skin is warm and dry.  Neurological:     General: No focal deficit present.     Mental Status: He is alert and oriented to person, place, and time.     Cranial Nerves: No cranial nerve deficit.     Motor: No weakness.  Psychiatric:        Mood and Affect: Mood normal.        Behavior: Behavior normal.      Results for orders placed or performed in visit on 01/19/24  POCT Glucose (CBG)  Result Value Ref Range   POC Glucose 90 70 - 99 mg/dl    Recent Results (from the past 2160 hours)  Lipid panel     Status: Abnormal   Collection Time: 01/16/24  8:16 AM  Result Value Ref Range   Cholesterol, Total 171 100 - 199 mg/dL   Triglycerides 44 0 - 149 mg/dL   HDL 55 >96 mg/dL   VLDL Cholesterol Cal 9 5 - 40 mg/dL   LDL Chol Calc (NIH) 295 (H) 0 - 99 mg/dL   Chol/HDL Ratio 3.1 0.0 - 5.0 ratio    Comment:                                   T. Chol/HDL Ratio                                             Men  Women                               1/2 Avg.Risk  3.4    3.3  Avg.Risk  5.0    4.4                                2X Avg.Risk  9.6    7.1                                3X Avg.Risk 23.4   11.0   Hemoglobin A1c     Status: None   Collection Time: 01/16/24  8:16 AM  Result Value Ref Range   Hgb A1c MFr Bld 5.1 4.8 - 5.6 %    Comment:          Prediabetes: 5.7 - 6.4          Diabetes: >6.4          Glycemic control for adults with diabetes: <7.0    Est. average glucose Bld gHb Est-mCnc 100 mg/dL  POCT Glucose (CBG)     Status: Normal   Collection Time: 01/19/24 11:33 AM  Result Value  Ref Range   POC Glucose 90 70 - 99 mg/dl      Assessment & Plan:  As per problem list  Problem List Items Addressed This Visit       Endocrine   Controlled type 2 diabetes mellitus without complication, without long-term current use of insulin (HCC) - Primary   Relevant Orders   POCT Glucose (CBG) (Completed)   Hemoglobin A1c     Other   Pure hypercholesterolemia   Relevant Orders   Comprehensive metabolic panel with GFR   Other Visit Diagnoses       Tinnitus of both ears       Relevant Orders   Ambulatory referral to ENT     Gastroesophageal reflux disease without esophagitis         Type 2 diabetes mellitus without complication, without long-term current use of insulin (HCC)         Benign prostatic hyperplasia without lower urinary tract symptoms       Relevant Orders   CBC With Diff/Platelet   PSA       Return in about 3 months (around 04/20/2024) for cpe with labs prior.   Total time spent: 20 minutes  Arzella Bitters, MD  01/19/2024   This document may have been prepared by [facility] Voice Recognition software and as such may include unintentional dictation errors.

## 2024-02-14 ENCOUNTER — Ambulatory Visit: Admitting: Dietician

## 2024-02-22 ENCOUNTER — Encounter: Attending: Internal Medicine | Admitting: Dietician

## 2024-02-22 ENCOUNTER — Encounter: Payer: Self-pay | Admitting: Dietician

## 2024-02-22 DIAGNOSIS — E119 Type 2 diabetes mellitus without complications: Secondary | ICD-10-CM | POA: Insufficient documentation

## 2024-02-22 NOTE — Patient Instructions (Addendum)
 Consider looking into high protein pasta (Barilla, or Red lentil or black bean pasta) or adding lean chicken to your pasta!  Bring a low fat string cheese to pair with your apples at lunch.  Work on moderating your saturated fats (fats from animal products) to aid in lowering your LDL cholesterol! Use your LDL Cholesterol Lowering Therapy handout for further helpful tips!!  Keep up the great work! You continue to be successful with your goals!!!

## 2024-02-22 NOTE — Progress Notes (Signed)
 Diabetes Self-Management Education  Visit Type: Follow-up  Appt. Start Time: 1610 Appt. End Time: 1650  02/22/2024  [name], identified by name and date of birth, is a 53 y.o. male with a diagnosis of Diabetes:  .   ASSESSMENT Pt reports continuing Ozempic  @2  mg weekly, reports mild GI upset, missed a recent dose and reported improvement in GI distress and sleep. Pt reports continuing weight loss, rate has slowed a little.  Pt reports improvement in A1c (5.1%), but slightly elevated LDL (107 mg/dL) Pt reports low level tinnitus that revealed some hearing loss at recent visit with ENT. 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 Pt reports eating a small snack around lunch (2 apples) when working .    Diabetes Self-Management Education - 02/22/24 1653       Visit Information   Visit Type Follow-up      Pre-Education Assessment   Patient understands the diabetes disease and treatment process. Demonstrates understanding / competency    Patient understands incorporating nutritional management into lifestyle. Comprehends key points    Patient undertands incorporating physical activity into lifestyle. Demonstrates understanding / competency    Patient understands using medications safely. Demonstrates understanding / competency    Patient understands monitoring blood glucose, interpreting and using results N/A (comment)    Patient understands prevention, detection, and treatment of acute complications. N/A (comment)    Patient understands prevention, detection, and treatment of chronic complications. Compreheands key points    Patient understands how to develop strategies to address psychosocial issues. Comprehends key points    Patient understands how to develop strategies to promote health/change behavior. Comprehends key  points      Complications   Last HgB A1C per patient/outside source 5.1 %   01/16/24   How often do you check your blood sugar? 0 times/day (not testing)      Dietary Intake   Breakfast grits, protein coffee, 6 strawberries    Lunch Water/Crystal lite, 2 apples    Dinner Pasta w/ avocado, olive oil, broccoli, Cesar salad, 1 cupcake    Beverage(s) water, Crystal lite      Activity / Exercise   Activity / Exercise Type ADL's;Strenuous (running)    How many days per week do you exercise? 7    How many minutes per day do you exercise? 60    Total minutes per week of exercise 420      Patient Education   Healthy Eating Meal options for control of blood glucose level and chronic complications.   LDL Cholesterol/Saturated Fats   Being Active Role of exercise on diabetes management, blood pressure control and cardiac health.   HDL   Medications Reviewed patients medication for diabetes, action, purpose, timing of dose and side effects.      Individualized Goals (developed by patient)   Nutrition Follow meal plan discussed   Low Saturated Fats   Physical Activity Exercise 5-7 days per week    Medications take my medication as prescribed      Patient Self-Evaluation of Goals - Patient rates self as meeting previously set goals (% of time)   Nutrition 50 - 75 % (half of the time)    Physical Activity >75% (most of the time)    Medications >75% (most of the time)    Monitoring Not Applicable  Problem Solving and behavior change strategies  >75% (most of the time)    Reducing Risk (treating acute and chronic complications) >75% (most of the time)    Health Coping >75% (most of the time)      Post-Education Assessment   Patient understands the diabetes disease and treatment process. Demonstrates understanding / competency    Patient understands incorporating nutritional management into lifestyle. Demonstrates understanding / competency    Patient undertands incorporating physical activity  into lifestyle. Demonstrates understanding / competency    Patient understands using medications safely. Demonstrates understanding / competency    Patient understands monitoring blood glucose, interpreting and using results N/A    Patient understands prevention, detection, and treatment of acute complications. N/A    Patient understands prevention, detection, and treatment of chronic complications. Demonstrates understanding / competency    Patient understands how to develop strategies to address psychosocial issues. Demonstrates understanding / competency    Patient understands how to develop strategies to promote health/change behavior. Demonstrates understanding / competency      Outcomes   Expected Outcomes Demonstrated interest in learning. Expect positive outcomes    Future DMSE 3-4 months    Program Status Not Completed      Subsequent Visit   Since your last visit have you continued or begun to take your medications as prescribed? Yes    Since your last visit have you had your blood pressure checked? Yes    Is your most recent blood pressure lower, unchanged, or higher since your last visit? Lower    Since your last visit have you experienced any weight changes? Loss    Weight Loss (lbs) 10    Since your last visit, are you checking your blood glucose at least once a day? No             Individualized Plan for Diabetes Self-Management Training:   Learning Objective:  Patient will have a greater understanding of diabetes self-management. Patient education plan is to attend individual and/or group sessions per assessed needs and concerns.   Plan:   Patient Instructions  Consider looking into high protein pasta (Barilla, or Red lentil or black bean pasta) or adding lean chicken to your pasta!  Bring a low fat string cheese to pair with your apples at lunch.  Work on moderating your saturated fats (fats from animal products) to aid in lowering your LDL cholesterol! Use  your LDL Cholesterol Lowering Therapy handout for further helpful tips!!  Keep up the great work! You continue to be successful with your goals!!!  Expected Outcomes:  Demonstrated interest in learning. Expect positive outcomes  Education material provided: LDL Cholesterol Lowering Therapy  If problems or questions, patient to contact team via:  Phone and Email  Future DSME appointment: 3-4 months

## 2024-03-08 ENCOUNTER — Other Ambulatory Visit: Payer: Self-pay | Admitting: Internal Medicine

## 2024-03-08 DIAGNOSIS — E119 Type 2 diabetes mellitus without complications: Secondary | ICD-10-CM

## 2024-04-23 ENCOUNTER — Other Ambulatory Visit

## 2024-04-24 LAB — COMPREHENSIVE METABOLIC PANEL WITH GFR
ALT: 24 IU/L (ref 0–44)
AST: 22 IU/L (ref 0–40)
Albumin: 4.6 g/dL (ref 3.8–4.9)
Alkaline Phosphatase: 76 IU/L (ref 44–121)
BUN/Creatinine Ratio: 18 (ref 9–20)
BUN: 21 mg/dL (ref 6–24)
Bilirubin Total: 0.4 mg/dL (ref 0.0–1.2)
CO2: 21 mmol/L (ref 20–29)
Calcium: 9.1 mg/dL (ref 8.7–10.2)
Chloride: 101 mmol/L (ref 96–106)
Creatinine, Ser: 1.14 mg/dL (ref 0.76–1.27)
Globulin, Total: 2.4 g/dL (ref 1.5–4.5)
Glucose: 87 mg/dL (ref 70–99)
Potassium: 4.5 mmol/L (ref 3.5–5.2)
Sodium: 138 mmol/L (ref 134–144)
Total Protein: 7 g/dL (ref 6.0–8.5)
eGFR: 77 mL/min/1.73 (ref >59)

## 2024-04-24 LAB — CBC WITH DIFF/PLATELET
Basophils Absolute: 0 x10E3/uL (ref 0.0–0.2)
Basos: 1 %
EOS (ABSOLUTE): 0.2 x10E3/uL (ref 0.0–0.4)
Eos: 3 %
Hematocrit: 44.4 % (ref 37.5–51.0)
Hemoglobin: 15 g/dL (ref 13.0–17.7)
Immature Grans (Abs): 0 x10E3/uL (ref 0.0–0.1)
Immature Granulocytes: 0 %
Lymphocytes Absolute: 1.3 x10E3/uL (ref 0.7–3.1)
Lymphs: 23 %
MCH: 31.1 pg (ref 26.6–33.0)
MCHC: 33.8 g/dL (ref 31.5–35.7)
MCV: 92 fL (ref 79–97)
Monocytes Absolute: 0.5 x10E3/uL (ref 0.1–0.9)
Monocytes: 9 %
Neutrophils Absolute: 3.7 x10E3/uL (ref 1.4–7.0)
Neutrophils: 64 %
Platelets: 190 x10E3/uL (ref 150–450)
RBC: 4.82 x10E6/uL (ref 4.14–5.80)
RDW: 12.6 % (ref 11.6–15.4)
WBC: 5.8 x10E3/uL (ref 3.4–10.8)

## 2024-04-24 LAB — HEMOGLOBIN A1C
Est. average glucose Bld gHb Est-mCnc: 97 mg/dL
Hgb A1c MFr Bld: 5 % (ref 4.8–5.6)

## 2024-04-24 LAB — PSA: Prostate Specific Ag, Serum: 1 ng/mL (ref 0.0–4.0)

## 2024-04-26 ENCOUNTER — Encounter: Payer: Self-pay | Admitting: Internal Medicine

## 2024-04-26 ENCOUNTER — Ambulatory Visit: Payer: Self-pay | Admitting: Internal Medicine

## 2024-04-26 ENCOUNTER — Ambulatory Visit: Admitting: Internal Medicine

## 2024-04-26 VITALS — BP 126/78 | HR 68 | Temp 98.2°F | Ht 70.0 in | Wt 249.2 lb

## 2024-04-26 DIAGNOSIS — Z1331 Encounter for screening for depression: Secondary | ICD-10-CM | POA: Diagnosis not present

## 2024-04-26 DIAGNOSIS — Z6841 Body Mass Index (BMI) 40.0 and over, adult: Secondary | ICD-10-CM | POA: Diagnosis not present

## 2024-04-26 DIAGNOSIS — E7800 Pure hypercholesterolemia, unspecified: Secondary | ICD-10-CM

## 2024-04-26 DIAGNOSIS — R7303 Prediabetes: Secondary | ICD-10-CM

## 2024-04-26 DIAGNOSIS — E119 Type 2 diabetes mellitus without complications: Secondary | ICD-10-CM

## 2024-04-26 DIAGNOSIS — Z0130 Encounter for examination of blood pressure without abnormal findings: Secondary | ICD-10-CM

## 2024-04-26 LAB — POCT CBG (FASTING - GLUCOSE)-MANUAL ENTRY: Glucose Fasting, POC: 95 mg/dL (ref 70–99)

## 2024-04-26 MED ORDER — METFORMIN HCL ER 500 MG PO TB24
500.0000 mg | ORAL_TABLET | Freq: Every day | ORAL | 1 refills | Status: AC
Start: 2024-04-26 — End: 2024-10-23

## 2024-04-26 MED ORDER — OZEMPIC (2 MG/DOSE) 8 MG/3ML ~~LOC~~ SOPN: 2.0000 mg | PEN_INJECTOR | SUBCUTANEOUS | 2 refills | Status: DC

## 2024-04-26 NOTE — Progress Notes (Signed)
 Established Patient Office Visit  Subjective:  Patient ID: Jose Carnell., male    DOB: 10/24/1970  Age: 53 y.o. MRN: [id number]  No chief complaint on file.   No new complaints, here for CPE, lab review and medication refills. Labs reviewed and notable for well controlled diabetes, A1c remains at target despite weight gain, lipids at target with unremarkable cmp. Denies any hypoglycemic episodes and home bg readings have been at target.     No other concerns at this time.   Past Medical History:  Diagnosis Date   Biceps tendon rupture, right, subsequent encounter    Hypertension     Past Surgical History:  Procedure Laterality Date   COLONOSCOPY WITH PROPOFOL  N/A 08/10/2015   Procedure: COLONOSCOPY WITH PROPOFOL ;  Surgeon: Deward CINDERELLA Piedmont, MD;  Location: [facility] ENDOSCOPY;  Service: Gastroenterology;  Laterality: N/A;    Social History   Socioeconomic History   Marital status: Married    Spouse name: Not on file   Number of children: Not on file   Years of education: Not on file   Highest education level: Not on file  Occupational History   Not on file  Tobacco Use   Smoking status: Never   Smokeless tobacco: Never  Substance and Sexual Activity   Alcohol use: Yes    Alcohol/week: 1.0 standard drink of alcohol    Types: 1 Cans of beer per week   Drug use: No   Sexual activity: Yes  Other Topics Concern   Not on file  Social History Narrative   Not on file   Social Drivers of Health   Financial Resource Strain: Not on file  Food Insecurity: Not on file  Transportation Needs: Not on file  Physical Activity: Not on file  Stress: Not on file  Social Connections: Not on file  Intimate Partner Violence: Not on file    History reviewed. No pertinent family history.  Allergies  Allergen Reactions   Lactose Intolerance (Gi) Cough    Outpatient Medications Prior to Visit  Medication Sig   cetirizine (ZYRTEC) 10 MG tablet Take 10 mg by mouth daily.    pantoprazole  (PROTONIX ) 20 MG tablet Take 1 tablet (20 mg total) by mouth daily. (Patient taking differently: Take 20 mg by mouth as needed.)   [DISCONTINUED] metFORMIN  (GLUCOPHAGE -XR) 500 MG 24 hr tablet Take 1 tablet (500 mg total) by mouth daily with breakfast.   [DISCONTINUED] Semaglutide , 2 MG/DOSE, (OZEMPIC , 2 MG/DOSE,) 8 MG/3ML SOPN INJECT 2 MG UNDER THE SKIN ONCE A WEEK   No facility-administered medications prior to visit.    Review of Systems  Constitutional:  Negative for weight loss (gained 7 lbs).  HENT: Negative.    Eyes: Negative.   Respiratory: Negative.    Cardiovascular: Negative.   Gastrointestinal:  Positive for heartburn. Negative for nausea and vomiting.  Genitourinary: Negative.   Skin: Negative.   Neurological: Negative.   Endo/Heme/Allergies: Negative.        Objective:   BP 126/78   Pulse 68   Temp 98.2 F (36.8 C)   Ht 5' 10 (1.778 m)   Wt 249 lb 3.2 oz (113 kg)   SpO2 98%   BMI 35.76 kg/m   Vitals:   04/26/24 1408  BP: 126/78  Pulse: 68  Temp: 98.2 F (36.8 C)  Height: 5' 10 (1.778 m)  Weight: 249 lb 3.2 oz (113 kg)  SpO2: 98%  BMI (Calculated): 35.76    Physical Exam Vitals reviewed.  Constitutional:  Appearance: Normal appearance. He is obese.  HENT:     Head: Normocephalic.     Left Ear: There is no impacted cerumen.     Nose: Nose normal.     Mouth/Throat:     Mouth: Mucous membranes are moist.     Pharynx: No posterior oropharyngeal erythema.  Eyes:     Extraocular Movements: Extraocular movements intact.     Pupils: Pupils are equal, round, and reactive to light.  Cardiovascular:     Rate and Rhythm: Regular rhythm.     Chest Wall: PMI is not displaced.     Pulses: Normal pulses.     Heart sounds: Normal heart sounds. No murmur heard. Pulmonary:     Effort: Pulmonary effort is normal.     Breath sounds: Normal air entry. No rhonchi or rales.  Abdominal:     General: Abdomen is flat. Bowel sounds are normal.  There is no distension.     Palpations: Abdomen is soft. There is no hepatomegaly, splenomegaly or mass.     Tenderness: There is no abdominal tenderness.  Musculoskeletal:        General: Normal range of motion.     Cervical back: Normal range of motion and neck supple.     Right lower leg: No edema.     Left lower leg: No edema.  Skin:    General: Skin is warm and dry.  Neurological:     General: No focal deficit present.     Mental Status: He is alert and oriented to person, place, and time.     Cranial Nerves: No cranial nerve deficit.     Motor: No weakness.  Psychiatric:        Mood and Affect: Mood normal.        Behavior: Behavior normal.      Results for orders placed or performed in visit on 04/26/24  POCT CBG (Fasting - Glucose)  Result Value Ref Range   Glucose Fasting, POC 95 70 - 99 mg/dL    Recent Results (from the past 2160 hours)  Comprehensive metabolic panel with GFR     Status: None   Collection Time: 04/23/24  8:12 AM  Result Value Ref Range   Glucose 87 70 - 99 mg/dL   BUN 21 6 - 24 mg/dL   Creatinine, Ser 8.85 0.76 - 1.27 mg/dL   eGFR 77 >40 fO/fpw/8.26   BUN/Creatinine Ratio 18 9 - 20   Sodium 138 134 - 144 mmol/L   Potassium 4.5 3.5 - 5.2 mmol/L   Chloride 101 96 - 106 mmol/L   CO2 21 20 - 29 mmol/L   Calcium 9.1 8.7 - 10.2 mg/dL   Total Protein 7.0 6.0 - 8.5 g/dL   Albumin 4.6 3.8 - 4.9 g/dL   Globulin, Total 2.4 1.5 - 4.5 g/dL   Bilirubin Total 0.4 0.0 - 1.2 mg/dL   Alkaline Phosphatase 76 44 - 121 IU/L   AST 22 0 - 40 IU/L   ALT 24 0 - 44 IU/L  CBC With Diff/Platelet     Status: None   Collection Time: 04/23/24  8:12 AM  Result Value Ref Range   WBC 5.8 3.4 - 10.8 x10E3/uL   RBC 4.82 4.14 - 5.80 x10E6/uL   Hemoglobin 15.0 13.0 - 17.7 g/dL   Hematocrit 55.5 62.4 - 51.0 %   MCV 92 79 - 97 fL   MCH 31.1 26.6 - 33.0 pg   MCHC 33.8 31.5 - 35.7 g/dL  RDW 12.6 11.6 - 15.4 %   Platelets 190 150 - 450 x10E3/uL   Neutrophils 64 Not  Estab. %   Lymphs 23 Not Estab. %   Monocytes 9 Not Estab. %   Eos 3 Not Estab. %   Basos 1 Not Estab. %   Neutrophils Absolute 3.7 1.4 - 7.0 x10E3/uL   Lymphocytes Absolute 1.3 0.7 - 3.1 x10E3/uL   Monocytes Absolute 0.5 0.1 - 0.9 x10E3/uL   EOS (ABSOLUTE) 0.2 0.0 - 0.4 x10E3/uL   Basophils Absolute 0.0 0.0 - 0.2 x10E3/uL   Immature Granulocytes 0 Not Estab. %   Immature Grans (Abs) 0.0 0.0 - 0.1 x10E3/uL  Hemoglobin A1c     Status: None   Collection Time: 04/23/24  8:12 AM  Result Value Ref Range   Hgb A1c MFr Bld 5.0 4.8 - 5.6 %    Comment:          Prediabetes: 5.7 - 6.4          Diabetes: >6.4          Glycemic control for adults with diabetes: <7.0    Est. average glucose Bld gHb Est-mCnc 97 mg/dL  PSA     Status: None   Collection Time: 04/23/24  8:12 AM  Result Value Ref Range   Prostate Specific Ag, Serum 1.0 0.0 - 4.0 ng/mL    Comment: Roche ECLIA methodology. According to the American Urological Association, Serum PSA should decrease and remain at undetectable levels after radical prostatectomy. The AUA defines biochemical recurrence as an initial PSA value 0.2 ng/mL or greater followed by a subsequent confirmatory PSA value 0.2 ng/mL or greater. Values obtained with different assay methods or kits cannot be used interchangeably. Results cannot be interpreted as absolute evidence of the presence or absence of malignant disease.   POCT CBG (Fasting - Glucose)     Status: None   Collection Time: 04/26/24  2:16 PM  Result Value Ref Range   Glucose Fasting, POC 95 70 - 99 mg/dL      Assessment & Plan:  Controlled type 2 diabetes mellitus without complication, without long-term current use of insulin (HCC) -     POCT CBG (Fasting - Glucose)  Pure hypercholesterolemia  Type 2 diabetes mellitus without complication, without long-term current use of insulin (HCC) -     Ozempic  (2 MG/DOSE); Inject 2 mg into the skin once a week. INJECT 2 MG UNDER THE SKIN ONCE A  WEEK  Dispense: 3 mL; Refill: 2 -     metFORMIN  HCl ER; Take 1 tablet (500 mg total) by mouth daily with breakfast.  Dispense: 90 tablet; Refill: 1  Morbid obesity with BMI of 40.0-44.9, adult (HCC)  Prediabetes    Problem List Items Addressed This Visit       Endocrine   Controlled type 2 diabetes mellitus without complication, without long-term current use of insulin (HCC) - Primary   Relevant Medications   Semaglutide , 2 MG/DOSE, (OZEMPIC , 2 MG/DOSE,) 8 MG/3ML SOPN   metFORMIN  (GLUCOPHAGE -XR) 500 MG 24 hr tablet   Other Relevant Orders   POCT CBG (Fasting - Glucose) (Completed)     Other   Morbid obesity with BMI of 40.0-44.9, adult (HCC)   Relevant Medications   Semaglutide , 2 MG/DOSE, (OZEMPIC , 2 MG/DOSE,) 8 MG/3ML SOPN   metFORMIN  (GLUCOPHAGE -XR) 500 MG 24 hr tablet   Pure hypercholesterolemia   Other Visit Diagnoses       Type 2 diabetes mellitus without complication, without long-term current use  of insulin (HCC)       Relevant Medications   Semaglutide , 2 MG/DOSE, (OZEMPIC , 2 MG/DOSE,) 8 MG/3ML SOPN   metFORMIN  (GLUCOPHAGE -XR) 500 MG 24 hr tablet     Prediabetes           Return in about 3 months (around 07/27/2024) for fu with labs prior.   Total time spent: 40 minutes  Sherrill Cinderella Perry, MD  04/26/2024   This document may have been prepared by [facility] Voice Recognition software and as such may include unintentional dictation errors.

## 2024-05-13 ENCOUNTER — Telehealth: Payer: Self-pay

## 2024-05-13 DIAGNOSIS — S86891A Other injury of other muscle(s) and tendon(s) at lower leg level, right leg, initial encounter: Secondary | ICD-10-CM

## 2024-05-13 NOTE — Telephone Encounter (Signed)
 Patient informed.

## 2024-05-13 NOTE — Telephone Encounter (Signed)
 Patient called asking for referral to an orthopedic dr for the shin splints, he states he's got to have it documented to see if it could have come from being in the [employer] [place]

## 2024-06-05 ENCOUNTER — Encounter: Payer: Self-pay | Admitting: Dietician

## 2024-06-05 ENCOUNTER — Encounter: Attending: Internal Medicine | Admitting: Dietician

## 2024-06-05 DIAGNOSIS — E119 Type 2 diabetes mellitus without complications: Secondary | ICD-10-CM | POA: Insufficient documentation

## 2024-06-05 DIAGNOSIS — Z7985 Long-term (current) use of injectable non-insulin antidiabetic drugs: Secondary | ICD-10-CM | POA: Diagnosis not present

## 2024-06-05 DIAGNOSIS — Z713 Dietary counseling and surveillance: Secondary | ICD-10-CM | POA: Insufficient documentation

## 2024-06-05 NOTE — Patient Instructions (Addendum)
 Have a small amount of carbohydrates (toast, grits, cereal, fruit) 30-60 minutes before your bike ride in the morning, then have your protein shake within 30 minutes of finishing your ride!  Pay attention to situations in which you find yourself binging on sweets or other foods and work on identifying triggers that lead to these events. This is an area to pay attention to moving forward to avoid allowing these behaviors to become habits again in the future.

## 2024-06-05 NOTE — Progress Notes (Signed)
 Diabetes Self-Management Education  Visit Type: Follow-up  Appt. Start Time: 0810 Appt. End Time: 0845  06/05/2024  [name], identified by name and date of birth, is a 53 y.o. male with a diagnosis of Diabetes:  .   ASSESSMENT Pt reports continuing Ozempic  @2mg , taking it 2 weeks on and 1 week off due to reflux, diarrhea, and nausea. Pt reports recent weight gain of 5-6 lbs, states they ate a lot of candy/fattier foods on vacation for a brief time, but has lost the weight back. Pt reports increasing exercise to 20 miles a morning on exercise bike, walks treadmill 2-3 times a week, working hard doing landscape work. Pt states that they are on 300+ days straight of work outs with [employer] Pt reports history of dealing with shin splints, saw ortho, will be visiting PT for consult/evaluation.   Diabetes Self-Management Education - 06/05/24 1008       Visit Information   Visit Type Follow-up      Pre-Education Assessment   Patient understands the diabetes disease and treatment process. Demonstrates understanding / competency    Patient understands incorporating nutritional management into lifestyle. Comprehends key points    Patient undertands incorporating physical activity into lifestyle. Demonstrates understanding / competency    Patient understands using medications safely. Comprehends key points    Patient understands monitoring blood glucose, interpreting and using results N/A (comment)    Patient understands prevention, detection, and treatment of acute complications. N/A (comment)    Patient understands prevention, detection, and treatment of chronic complications. Demonstrates understanding / competency    Patient understands how to develop strategies to address psychosocial issues. Comprehends key points    Patient understands how to develop strategies to promote health/change behavior. Needs Review      Complications   Last HgB A1C per patient/outside source 5 %    04/23/2024   How often do you check your blood sugar? 0 times/day (not testing)      Dietary Intake   Breakfast Cup of Lasagna soup, 2 slices of bread w/ garlic butter    Dinner [employer], potatoes, green beans    Snack (evening) 1/2 bag of buttered popcorn    Beverage(s) Sugar-free flavored water, plain water      Activity / Exercise   Activity / Exercise Type ADL's;Moderate (swimming / aerobic walking)    How many days per week do you exercise? 7    How many minutes per day do you exercise? 60    Total minutes per week of exercise 420      Patient Education   Healthy Eating Information on hints to eating out and maintain blood glucose control.    Being Active Identified with patient nutritional and/or medication changes necessary with exercise.   fueling/recovery for workouts   Medications Reviewed patients medication for diabetes, action, purpose, timing of dose and side effects.    Lifestyle and Health Coping Lifestyle issues that need to be addressed for better diabetes care   Binge eating     Individualized Goals (developed by patient)   Nutrition Follow meal plan discussed;Adjust meds/carbs with exercise as discussed    Physical Activity Exercise 5-7 days per week    Medications take my medication as prescribed    Problem Solving Eating Pattern      Patient Self-Evaluation of Goals - Patient rates self as meeting previously set goals (% of time)   Nutrition >75% (most of the time)    Physical Activity >75% (most  of the time)    Medications >75% (most of the time)    Monitoring Not Applicable    Problem Solving and behavior change strategies  50 - 75 % (half of the time)    Reducing Risk (treating acute and chronic complications) >75% (most of the time)    Health Coping 50 - 75 % (half of the time)      Post-Education Assessment   Patient understands the diabetes disease and treatment process. Demonstrates understanding / competency    Patient understands  incorporating nutritional management into lifestyle. Demonstrates understanding / competency    Patient undertands incorporating physical activity into lifestyle. Demonstrates understanding / competency    Patient understands using medications safely. Demonstrates understanding / competency    Patient understands monitoring blood glucose, interpreting and using results N/A    Patient understands prevention, detection, and treatment of acute complications. N/A    Patient understands prevention, detection, and treatment of chronic complications. Demonstrates understanding / competency    Patient understands how to develop strategies to address psychosocial issues. Demonstrates understanding / competency    Patient understands how to develop strategies to promote health/change behavior. Comprehends key points      Outcomes   Expected Outcomes Demonstrated interest in learning. Expect positive outcomes    Future DMSE 3-4 months    Program Status Not Completed      Subsequent Visit   Since your last visit have you continued or begun to take your medications as prescribed? Yes    Since your last visit have you had your blood pressure checked? Yes    Is your most recent blood pressure lower, unchanged, or higher since your last visit? Unchanged    Since your last visit have you experienced any weight changes? Gain    Weight Gain (lbs) 6    Since your last visit, are you checking your blood glucose at least once a day? No          Individualized Plan for Diabetes Self-Management Training:   Learning Objective:  Patient will have a greater understanding of diabetes self-management. Patient education plan is to attend individual and/or group sessions per assessed needs and concerns.   Plan:   Patient Instructions  Have a small amount of carbohydrates (toast, grits, cereal, fruit) 30-60 minutes before your bike ride in the morning, then have your protein shake within 30 minutes of finishing  your ride!  Pay attention to situations in which you find yourself binging on sweets or other foods and work on identifying triggers that lead to these events. This is an area to pay attention to moving forward to avoid allowing these behaviors to become habits again in the future.    Expected Outcomes:  Demonstrated interest in learning. Expect positive outcomes  Education material provided: Eating before Exercise, Eating for Recovery  If problems or questions, patient to contact team via:  Phone and Email  Future DSME appointment: 3-4 months

## 2024-07-29 ENCOUNTER — Other Ambulatory Visit

## 2024-07-29 DIAGNOSIS — E119 Type 2 diabetes mellitus without complications: Secondary | ICD-10-CM

## 2024-07-30 LAB — LIPID PANEL
Chol/HDL Ratio: 3.2 ratio (ref 0.0–5.0)
Cholesterol, Total: 173 mg/dL (ref 100–199)
HDL: 54 mg/dL (ref >39)
LDL Chol Calc (NIH): 110 mg/dL — ABNORMAL HIGH (ref 0–99)
Triglycerides: 42 mg/dL (ref 0–149)
VLDL Cholesterol Cal: 9 mg/dL (ref 5–40)

## 2024-07-30 LAB — HEMOGLOBIN A1C
Est. average glucose Bld gHb Est-mCnc: 105 mg/dL
Hgb A1c MFr Bld: 5.3 % (ref 4.8–5.6)

## 2024-08-02 ENCOUNTER — Ambulatory Visit: Payer: Self-pay | Admitting: Internal Medicine

## 2024-08-02 ENCOUNTER — Ambulatory Visit: Admitting: Internal Medicine

## 2024-08-02 VITALS — BP 120/86 | HR 66 | Temp 97.9°F | Ht 70.0 in | Wt 252.0 lb

## 2024-08-02 DIAGNOSIS — G4709 Other insomnia: Secondary | ICD-10-CM | POA: Diagnosis not present

## 2024-08-02 DIAGNOSIS — E1165 Type 2 diabetes mellitus with hyperglycemia: Secondary | ICD-10-CM

## 2024-08-02 DIAGNOSIS — K219 Gastro-esophageal reflux disease without esophagitis: Secondary | ICD-10-CM | POA: Diagnosis not present

## 2024-08-02 DIAGNOSIS — Z0130 Encounter for examination of blood pressure without abnormal findings: Secondary | ICD-10-CM

## 2024-08-02 DIAGNOSIS — E119 Type 2 diabetes mellitus without complications: Secondary | ICD-10-CM

## 2024-08-02 LAB — POCT CBG (FASTING - GLUCOSE)-MANUAL ENTRY: Glucose Fasting, POC: 88 mg/dL (ref 70–99)

## 2024-08-02 MED ORDER — PANTOPRAZOLE SODIUM 20 MG PO TBEC: 20.0000 mg | DELAYED_RELEASE_TABLET | ORAL | 2 refills | Status: AC | PRN

## 2024-08-02 MED ORDER — OZEMPIC (2 MG/DOSE) 8 MG/3ML ~~LOC~~ SOPN: 2.0000 mg | PEN_INJECTOR | SUBCUTANEOUS | 2 refills | Status: AC

## 2024-08-02 NOTE — Progress Notes (Signed)
 Established Patient Office Visit  Subjective:  Patient ID: Jose Paul., male    DOB: 07-09-1971  Age: 53 y.o. MRN: [id number]  Chief Complaint  Patient presents with   Follow-up    3 month lab results    C/o chronic sleep disturbance and narcolepsy, here for lab review and medication refills. Labs reviewed and notable for well controlled/uncontrolled diabetes, A1c remains at target but  lipids notable for elevated LDL. Denies any hypoglycemic episodes and home bg readings have been at target.     No other concerns at this time.   Past Medical History:  Diagnosis Date   Biceps tendon rupture, right, subsequent encounter    Hypertension     Past Surgical History:  Procedure Laterality Date   COLONOSCOPY WITH PROPOFOL  N/A 08/10/2015   Procedure: COLONOSCOPY WITH PROPOFOL ;  Surgeon: Deward CINDERELLA Piedmont, MD;  Location: [facility] ENDOSCOPY;  Service: Gastroenterology;  Laterality: N/A;    Social History   Socioeconomic History   Marital status: Married    Spouse name: Not on file   Number of children: Not on file   Years of education: Not on file   Highest education level: Not on file  Occupational History   Not on file  Tobacco Use   Smoking status: Never   Smokeless tobacco: Never  Substance and Sexual Activity   Alcohol use: Yes    Alcohol/week: 1.0 standard drink of alcohol    Types: 1 Cans of beer per week   Drug use: No   Sexual activity: Yes  Other Topics Concern   Not on file  Social History Narrative   Not on file   Social Drivers of Health   Financial Resource Strain: Not on file  Food Insecurity: Not on file  Transportation Needs: Not on file  Physical Activity: Not on file  Stress: Not on file  Social Connections: Not on file  Intimate Partner Violence: Not on file    No family history on file.  Allergies  Allergen Reactions   Lactose Intolerance (Gi) Cough    Outpatient Medications Prior to Visit  Medication Sig   cetirizine (ZYRTEC) 10  MG tablet Take 10 mg by mouth daily.   metFORMIN  (GLUCOPHAGE -XR) 500 MG 24 hr tablet Take 1 tablet (500 mg total) by mouth daily with breakfast.   [DISCONTINUED] pantoprazole  (PROTONIX ) 20 MG tablet Take 1 tablet (20 mg total) by mouth daily. (Patient taking differently: Take 20 mg by mouth as needed.)   [DISCONTINUED] Semaglutide , 2 MG/DOSE, (OZEMPIC , 2 MG/DOSE,) 8 MG/3ML SOPN Inject 2 mg into the skin once a week. INJECT 2 MG UNDER THE SKIN ONCE A WEEK   No facility-administered medications prior to visit.    Review of Systems  Constitutional:  Negative for weight loss (gained 3 lbs).  HENT: Negative.    Eyes: Negative.   Respiratory: Negative.    Cardiovascular: Negative.   Gastrointestinal:  Positive for heartburn. Negative for nausea and vomiting.  Genitourinary: Negative.   Skin: Negative.   Neurological: Negative.   Endo/Heme/Allergies: Negative.        Objective:   BP 120/86   Pulse 66   Temp 97.9 F (36.6 C)   Ht 5' 10 (1.778 m)   Wt 252 lb (114.3 kg)   SpO2 97%   BMI 36.16 kg/m   Vitals:   08/02/24 1357  BP: 120/86  Pulse: 66  Temp: 97.9 F (36.6 C)  Height: 5' 10 (1.778 m)  Weight: 252 lb (114.3 kg)  SpO2: 97%  BMI (Calculated): 36.16    Physical Exam Vitals reviewed.  Constitutional:      Appearance: Normal appearance. He is obese.  HENT:     Head: Normocephalic.     Left Ear: There is no impacted cerumen.     Nose: Nose normal.     Mouth/Throat:     Mouth: Mucous membranes are moist.     Pharynx: No posterior oropharyngeal erythema.  Eyes:     Extraocular Movements: Extraocular movements intact.     Pupils: Pupils are equal, round, and reactive to light.  Cardiovascular:     Rate and Rhythm: Regular rhythm.     Chest Wall: PMI is not displaced.     Pulses: Normal pulses.     Heart sounds: Normal heart sounds. No murmur heard. Pulmonary:     Effort: Pulmonary effort is normal.     Breath sounds: Normal air entry. No rhonchi or rales.   Abdominal:     General: Abdomen is flat. Bowel sounds are normal. There is no distension.     Palpations: Abdomen is soft. There is no hepatomegaly, splenomegaly or mass.     Tenderness: There is no abdominal tenderness.  Musculoskeletal:        General: Normal range of motion.     Cervical back: Normal range of motion and neck supple.     Right lower leg: No edema.     Left lower leg: No edema.  Skin:    General: Skin is warm and dry.  Neurological:     General: No focal deficit present.     Mental Status: He is alert and oriented to person, place, and time.     Cranial Nerves: No cranial nerve deficit.     Motor: No weakness.  Psychiatric:        Mood and Affect: Mood normal.        Behavior: Behavior normal.      Results for orders placed or performed in visit on 08/02/24  POCT CBG (Fasting - Glucose)  Result Value Ref Range   Glucose Fasting, POC 88 70 - 99 mg/dL    Recent Results (from the past 2160 hours)  Lipid panel     Status: Abnormal   Collection Time: 07/29/24  8:47 AM  Result Value Ref Range   Cholesterol, Total 173 100 - 199 mg/dL   Triglycerides 42 0 - 149 mg/dL   HDL 54 >60 mg/dL   VLDL Cholesterol Cal 9 5 - 40 mg/dL   LDL Chol Calc (NIH) 889 (H) 0 - 99 mg/dL   Chol/HDL Ratio 3.2 0.0 - 5.0 ratio    Comment:                                   T. Chol/HDL Ratio                                             Men  Women                               1/2 Avg.Risk  3.4    3.3  Avg.Risk  5.0    4.4                                2X Avg.Risk  9.6    7.1                                3X Avg.Risk 23.4   11.0   Hemoglobin A1c     Status: None   Collection Time: 07/29/24  8:47 AM  Result Value Ref Range   Hgb A1c MFr Bld 5.3 4.8 - 5.6 %    Comment:          Prediabetes: 5.7 - 6.4          Diabetes: >6.4          Glycemic control for adults with diabetes: <7.0    Est. average glucose Bld gHb Est-mCnc 105 mg/dL  POCT CBG  (Fasting - Glucose)     Status: Normal   Collection Time: 08/02/24  2:04 PM  Result Value Ref Range   Glucose Fasting, POC 88 70 - 99 mg/dL      Assessment & Plan:  Jose Paul was seen today for follow-up.  Type 2 diabetes mellitus without complication, without long-term current use of insulin (HCC) -     POCT CBG (Fasting - Glucose) -     Ozempic  (2 MG/DOSE); Inject 2 mg into the skin once a week. INJECT 2 MG UNDER THE SKIN ONCE A WEEK  Dispense: 3 mL; Refill: 2 -     Lipid panel -     Hemoglobin A1c  Other insomnia -     Ambulatory referral to Sleep Studies  Gastroesophageal reflux disease without esophagitis -     Pantoprazole  Sodium; Take 1 tablet (20 mg total) by mouth as needed.  Dispense: 30 tablet; Refill: 2    Problem List Items Addressed This Visit   None Visit Diagnoses       Type 2 diabetes mellitus without complication, without long-term current use of insulin (HCC)    -  Primary   Relevant Medications   Semaglutide , 2 MG/DOSE, (OZEMPIC , 2 MG/DOSE,) 8 MG/3ML SOPN   Other Relevant Orders   POCT CBG (Fasting - Glucose) (Completed)     Other insomnia       Relevant Orders   Ambulatory referral to Sleep Studies     Gastroesophageal reflux disease without esophagitis       Relevant Medications   pantoprazole  (PROTONIX ) 20 MG tablet       Return in about 3 months (around 11/02/2024) for fu with labs prior.   Total time spent: 20 minutes. This time includes review of previous notes and results and patient face to face interaction during today'Terica Yogi visit.    Sherrill Cinderella Perry, MD  08/02/2024   This document may have been prepared by [facility] Voice Recognition software and as such may include unintentional dictation errors.

## 2024-09-24 ENCOUNTER — Telehealth: Payer: Self-pay

## 2024-09-24 DIAGNOSIS — E119 Type 2 diabetes mellitus without complications: Secondary | ICD-10-CM

## 2024-09-24 NOTE — Telephone Encounter (Signed)
 Please send new referral for pt, the one on file has expired, they are requesting new one

## 2024-09-27 ENCOUNTER — Ambulatory Visit: Admitting: Dietician

## 2024-10-11 ENCOUNTER — Ambulatory Visit: Payer: Self-pay | Admitting: Internal Medicine

## 2024-10-25 ENCOUNTER — Other Ambulatory Visit

## 2024-10-25 DIAGNOSIS — E782 Mixed hyperlipidemia: Secondary | ICD-10-CM

## 2024-10-25 DIAGNOSIS — E119 Type 2 diabetes mellitus without complications: Secondary | ICD-10-CM

## 2024-10-25 DIAGNOSIS — N400 Enlarged prostate without lower urinary tract symptoms: Secondary | ICD-10-CM

## 2024-10-25 DIAGNOSIS — E7800 Pure hypercholesterolemia, unspecified: Secondary | ICD-10-CM

## 2024-11-01 ENCOUNTER — Ambulatory Visit: Admitting: Internal Medicine
